# Patient Record
Sex: Male | Born: 1935 | Race: White | Hispanic: No | Smoking: Former smoker
Health system: Southern US, Community
[De-identification: ages and names within clinical notes are randomized; demographics above are authoritative.]

## PROBLEM LIST (undated history)

## (undated) DIAGNOSIS — E785 Hyperlipidemia, unspecified: Secondary | ICD-10-CM

## (undated) DIAGNOSIS — F329 Major depressive disorder, single episode, unspecified: Secondary | ICD-10-CM

## (undated) DIAGNOSIS — C679 Malignant neoplasm of bladder, unspecified: Secondary | ICD-10-CM

## (undated) DIAGNOSIS — F0390 Unspecified dementia without behavioral disturbance: Secondary | ICD-10-CM

## (undated) DIAGNOSIS — J449 Chronic obstructive pulmonary disease, unspecified: Secondary | ICD-10-CM

## (undated) DIAGNOSIS — F32A Depression, unspecified: Secondary | ICD-10-CM

## (undated) DIAGNOSIS — E119 Type 2 diabetes mellitus without complications: Secondary | ICD-10-CM

## (undated) DIAGNOSIS — F419 Anxiety disorder, unspecified: Secondary | ICD-10-CM

## (undated) HISTORY — PX: CHOLECYSTECTOMY: SHX55

## (undated) HISTORY — DX: Anxiety disorder, unspecified: F41.9

## (undated) HISTORY — DX: Hyperlipidemia, unspecified: E78.5

## (undated) HISTORY — DX: Type 2 diabetes mellitus without complications: E11.9

## (undated) HISTORY — PX: FOOT SURGERY: SHX648

## (undated) HISTORY — PX: BACK SURGERY: SHX140

## (undated) HISTORY — DX: Depression, unspecified: F32.A

## (undated) HISTORY — DX: Major depressive disorder, single episode, unspecified: F32.9

## (undated) HISTORY — DX: Unspecified dementia, unspecified severity, without behavioral disturbance, psychotic disturbance, mood disturbance, and anxiety: F03.90

## (undated) HISTORY — DX: Malignant neoplasm of bladder, unspecified: C67.9

---

## 2004-09-13 ENCOUNTER — Emergency Department: Payer: Self-pay | Admitting: Emergency Medicine

## 2004-09-23 ENCOUNTER — Ambulatory Visit: Payer: Self-pay | Admitting: General Surgery

## 2004-10-11 ENCOUNTER — Ambulatory Visit: Payer: Self-pay

## 2005-09-06 ENCOUNTER — Emergency Department: Payer: Self-pay | Admitting: Emergency Medicine

## 2007-02-17 ENCOUNTER — Emergency Department: Payer: Self-pay | Admitting: Emergency Medicine

## 2007-08-31 ENCOUNTER — Inpatient Hospital Stay: Payer: Self-pay | Admitting: Unknown Physician Specialty

## 2009-04-25 DIAGNOSIS — C679 Malignant neoplasm of bladder, unspecified: Secondary | ICD-10-CM

## 2009-04-25 HISTORY — DX: Malignant neoplasm of bladder, unspecified: C67.9

## 2009-12-01 ENCOUNTER — Ambulatory Visit: Payer: Self-pay | Admitting: General Practice

## 2010-01-27 ENCOUNTER — Ambulatory Visit: Payer: Self-pay | Admitting: Urology

## 2010-02-10 ENCOUNTER — Ambulatory Visit: Payer: Self-pay | Admitting: Urology

## 2010-02-11 ENCOUNTER — Emergency Department: Payer: Self-pay | Admitting: Emergency Medicine

## 2010-02-11 LAB — PATHOLOGY REPORT

## 2010-02-15 ENCOUNTER — Emergency Department: Payer: Self-pay | Admitting: Emergency Medicine

## 2010-02-17 ENCOUNTER — Inpatient Hospital Stay: Payer: Self-pay | Admitting: Internal Medicine

## 2011-01-29 ENCOUNTER — Emergency Department: Payer: Self-pay | Admitting: Emergency Medicine

## 2011-03-26 ENCOUNTER — Inpatient Hospital Stay: Payer: Self-pay

## 2011-05-16 ENCOUNTER — Emergency Department: Payer: Self-pay | Admitting: Emergency Medicine

## 2011-10-07 ENCOUNTER — Ambulatory Visit: Payer: Self-pay | Admitting: Internal Medicine

## 2011-10-07 LAB — CREATININE, SERUM
Creatinine: 0.89 mg/dL (ref 0.60–1.30)
EGFR (Non-African Amer.): >60

## 2011-12-22 ENCOUNTER — Emergency Department: Payer: Self-pay | Admitting: Unknown Physician Specialty

## 2011-12-22 LAB — URINALYSIS, COMPLETE
Bilirubin,UR: NEGATIVE
Blood: NEGATIVE
Ketone: NEGATIVE
Ph: 5 (ref 4.5–8.0)
Squamous Epithelial: 1

## 2011-12-22 LAB — COMPREHENSIVE METABOLIC PANEL
Albumin: 3.4 g/dL (ref 3.4–5.0)
Anion Gap: 7 (ref 7–16)
BUN: 20 mg/dL — ABNORMAL HIGH (ref 7–18)
Glucose: 112 mg/dL — ABNORMAL HIGH (ref 65–99)
Osmolality: 286 (ref 275–301)
Potassium: 4.5 mmol/L (ref 3.5–5.1)
Sodium: 142 mmol/L (ref 136–145)
Total Protein: 7.1 g/dL (ref 6.4–8.2)

## 2011-12-22 LAB — CBC
HCT: 41.2 % (ref 40.0–52.0)
HGB: 13.6 g/dL (ref 13.0–18.0)
MCH: 32.4 pg (ref 26.0–34.0)
MCV: 98 fL (ref 80–100)
RBC: 4.21 10*6/uL — ABNORMAL LOW (ref 4.40–5.90)

## 2012-04-08 ENCOUNTER — Emergency Department: Payer: Self-pay | Admitting: Emergency Medicine

## 2012-04-08 LAB — URINALYSIS, COMPLETE
Bacteria: NONE SEEN
Bilirubin,UR: NEGATIVE
Glucose,UR: NEGATIVE mg/dL (ref 0–75)
Ketone: NEGATIVE
Nitrite: NEGATIVE
RBC,UR: 2 /HPF (ref 0–5)
Squamous Epithelial: NONE SEEN
WBC UR: 2 /HPF (ref 0–5)

## 2012-04-08 LAB — COMPREHENSIVE METABOLIC PANEL
Albumin: 3.2 g/dL — ABNORMAL LOW (ref 3.4–5.0)
Alkaline Phosphatase: 77 U/L (ref 50–136)
BUN: 15 mg/dL (ref 7–18)
Chloride: 109 mmol/L — ABNORMAL HIGH (ref 98–107)
SGOT(AST): 45 U/L — ABNORMAL HIGH (ref 15–37)
SGPT (ALT): 28 U/L (ref 12–78)
Total Protein: 6.6 g/dL (ref 6.4–8.2)

## 2012-04-08 LAB — CBC
MCHC: 33.7 g/dL (ref 32.0–36.0)
Platelet: 188 10*3/uL (ref 150–440)
RDW: 13.4 % (ref 11.5–14.5)
WBC: 6.6 10*3/uL (ref 3.8–10.6)

## 2012-04-08 LAB — TROPONIN I: Troponin-I: <0.02 ng/mL

## 2012-04-09 ENCOUNTER — Encounter: Payer: Self-pay | Admitting: *Deleted

## 2012-04-10 ENCOUNTER — Ambulatory Visit (INDEPENDENT_AMBULATORY_CARE_PROVIDER_SITE_OTHER): Payer: Medicare Other | Admitting: Cardiovascular Disease

## 2012-04-10 ENCOUNTER — Encounter: Payer: Self-pay | Admitting: Cardiovascular Disease

## 2012-04-10 VITALS — BP 121/75 | HR 57 | Ht 70.5 in | Wt 189.0 lb

## 2012-04-10 DIAGNOSIS — R55 Syncope and collapse: Secondary | ICD-10-CM | POA: Insufficient documentation

## 2012-04-10 NOTE — Patient Instructions (Addendum)
Your physician has requested that you have an echocardiogram. Echocardiography is a painless test that uses sound waves to create images of your heart. It provides your doctor with information about the size and shape of your heart and how well your heart's chambers and valves are working. This procedure takes approximately one hour. There are no restrictions for this procedure.  Your physician has recommended that you wear a holter monitor. Holter monitors are medical devices that record the heart's electrical activity. Doctors most often use these monitors to diagnose arrhythmias. Arrhythmias are problems with the speed or rhythm of the heartbeat. The monitor is a small, portable device. You can wear one while you do your normal daily activities. This is usually used to diagnose what is causing palpitations/syncope (passing out).  Follow up after tests.  

## 2012-04-10 NOTE — Progress Notes (Signed)
Primary care physician: Dr. Marcello Fennel  HPI  This is an 78 year old male who was referred from the emergency room at [facility] for evaluation of presyncope and sinus bradycardia. The patient is not aware of any previous cardiac history. He has chronic medical conditions that include hyperlipidemia, mild dementia, diet-controlled diabetes, depression and history of bladder cancer status post resection in 2011. He also reports to me been diagnosed with hypothyroidism few weeks ago. He was started on levothyroxine 50 mcg once daily. Over the last few weeks, the patient has noticed progressive lightheadedness without syncope. It was particularly severe 2 days ago and thus he went to the emergency room at [facility]. He was noted to be bradycardic with a heart rate of 46 beats per minute. His labs were unremarkable. There was no evidence of anemia or volume depletion. He was discharged home and asked to followup with [place]. He denies any chest pain or dyspnea. He is not on any antihypertensive medications.  No Known Allergies   Current Outpatient Prescriptions on File Prior to Visit  Medication Sig Dispense Refill  . levothyroxine (SYNTHROID, LEVOTHROID) 50 MCG tablet Take 50 mcg by mouth daily.      Marland Kitchen PARoxetine (PAXIL) 20 MG tablet Take 20 mg by mouth every morning.      . pravastatin (PRAVACHOL) 40 MG tablet Take 40 mg by mouth daily.         Past Medical History  Diagnosis Date  . Hyperlipidemia   . Dementia   . Gestational diabetes, diet controlled   . Depression   . Anxiety   . Bladder cancer 2011    s/p resection with mitomycin injection;Dr.Stoioff     Past Surgical History  Procedure Date  . Back surgery     x4  . Cholecystectomy   . Foot surgery      History reviewed. No pertinent family history.   History   Social History  . Marital Status: Married    Spouse Name: N/A    Number of Children: N/A  . Years of Education: N/A   Occupational History  . Not on file.   Social History  Main Topics  . Smoking status: Former Smoker -- 1.5 packs/day for 25 years    Types: Cigarettes  . Smokeless tobacco: Not on file  . Alcohol Use: No  . Drug Use: No  . Sexually Active:    Other Topics Concern  . Not on file   Social History Narrative  . No narrative on file     ROS Constitutional: Negative for fever, chills, diaphoresis, activity change, appetite change.   HENT: Negative for hearing loss, nosebleeds, congestion, sore throat, facial swelling, drooling, trouble swallowing, neck pain, voice change, sinus pressure and tinnitus.  Eyes: Negative for photophobia, pain, discharge and visual disturbance.  Respiratory: Negative for apnea, cough, chest tightness, shortness of breath and wheezing.  Cardiovascular: Negative for chest pain, palpitations and leg swelling.  Gastrointestinal: Negative for nausea, vomiting, abdominal pain, diarrhea, constipation, blood in stool and abdominal distention.  Genitourinary: Negative for dysuria, urgency, frequency, hematuria and decreased urine volume.  Skin: Negative for color change, pallor, rash and wound.  Neurological: Negative for  tremors, seizures, syncope, speech difficulty, weakness, light-headedness, numbness and headaches.  Psychiatric/Behavioral: Negative for suicidal ideas, hallucinations, behavioral problems and agitation. The patient is not nervous/anxious.     PHYSICAL EXAM   BP 121/75  Pulse 57  Ht 5' 10.5" (1.791 m)  Wt 189 lb (85.73 kg)  BMI 26.74 kg/m2  Constitutional: He is oriented to person, place, and time. He appears well-developed and well-nourished. No distress.  HENT: No nasal discharge.  Head: Normocephalic and atraumatic.  Eyes: Pupils are equal and round. Right eye exhibits no discharge. Left eye exhibits no discharge.  Neck: Normal range of motion. Neck supple. No JVD present. No thyromegaly present.  Cardiovascular: Bradycardic, regular rhythm, normal heart sounds and. Exam reveals no gallop and  no friction rub. No murmur heard.  Pulmonary/Chest: Effort normal and breath sounds normal. No stridor. No respiratory distress. He has no wheezes. He has no rales. He exhibits no tenderness.  Abdominal: Soft. Bowel sounds are normal. He exhibits no distension. There is no tenderness. There is no rebound and no guarding.  Musculoskeletal: Normal range of motion. He exhibits no edema and no tenderness.  Neurological: He is alert and oriented to person, place, and time. Coordination normal.  Skin: Skin is warm and dry. No rash noted. He is not diaphoretic. No erythema. No pallor.  Psychiatric: He has a normal mood and affect. His behavior is normal. Judgment and thought content normal.      EKG: Marked sinus  Bradycardia  BORDERLINE RHYTHM   ASSESSMENT AND PLAN

## 2012-04-10 NOTE — Assessment & Plan Note (Signed)
Recent symptoms of dizziness associated with presyncope with baseline sinus bradycardia. There is no evidence of AV block or significant pauses on the ECG. Recent labs were unremarkable. He was recently started on levothyroxine for hypothyroidism which might be contributing to his sinus bradycardia. I recommend evaluation with a 48-hour Holter monitor as well as an echocardiogram to evaluate LV systolic function. I will have him followup with me after her cardiac testing. I will likely repeat his TSH at that time. He has no symptoms suggestive of angina.

## 2012-04-16 ENCOUNTER — Encounter: Payer: Self-pay | Admitting: Cardiovascular Disease

## 2012-04-19 ENCOUNTER — Other Ambulatory Visit (INDEPENDENT_AMBULATORY_CARE_PROVIDER_SITE_OTHER): Payer: Medicare Other

## 2012-04-19 ENCOUNTER — Encounter (INDEPENDENT_AMBULATORY_CARE_PROVIDER_SITE_OTHER): Payer: Medicare Other

## 2012-04-19 ENCOUNTER — Other Ambulatory Visit: Payer: Self-pay

## 2012-04-19 DIAGNOSIS — R55 Syncope and collapse: Secondary | ICD-10-CM

## 2012-04-19 DIAGNOSIS — R42 Dizziness and giddiness: Secondary | ICD-10-CM

## 2012-04-19 NOTE — Progress Notes (Unsigned)
Placed 48 hour holter monitor on 04/19/12-04/21/12. Arida.

## 2012-04-26 ENCOUNTER — Ambulatory Visit: Payer: Medicare Other | Admitting: Cardiovascular Disease

## 2012-04-30 ENCOUNTER — Telehealth: Payer: Self-pay

## 2012-04-30 NOTE — Telephone Encounter (Signed)
Dr. Kirke Corin reviewed holter results They are as follows: "normal sinus rhythm. Sinus bradycardia lowest 39 BPM during sleep. Longest pause was 1.8 seconds" VO Dr. Adline Mango, RN Will call pt to inform

## 2012-04-30 NOTE — Telephone Encounter (Signed)
Pt informed Understanding verb appt confirmed for 05/17/12

## 2012-05-17 ENCOUNTER — Encounter: Payer: Self-pay | Admitting: *Deleted

## 2012-05-17 ENCOUNTER — Ambulatory Visit (INDEPENDENT_AMBULATORY_CARE_PROVIDER_SITE_OTHER): Payer: Medicare Other | Admitting: Cardiovascular Disease

## 2012-05-17 ENCOUNTER — Encounter: Payer: Self-pay | Admitting: Cardiovascular Disease

## 2012-05-17 VITALS — BP 118/64 | HR 56 | Ht 70.5 in | Wt 287.2 lb

## 2012-05-17 DIAGNOSIS — R55 Syncope and collapse: Secondary | ICD-10-CM

## 2012-05-17 NOTE — Patient Instructions (Addendum)
Follow up in 6 months 

## 2012-05-17 NOTE — Progress Notes (Signed)
Primary care physician: Dr. Marcello Fennel  HPI  This is an 79 year old male who is here today for a followup visit regarding presyncope and sinus bradycardia. The patient is not aware of any previous cardiac history. He has chronic medical conditions that include hyperlipidemia, mild dementia, diet-controlled diabetes, depression and history of bladder cancer status post resection in 2011. He was diagnosed with hypothyroidism and started on levothyroxine 50 mcg once daily. Around the same time, he started having dizziness and presyncopal episode. He went to the emergency room at [facility] and was found to have a heart rate of 46 beats per minute. He underwent a 48 hour Holter monitor which showed sinus rhythm with sinus bradycardia. Lowest heart rate was 39 beats per minute likely during sleep. Longest pause was 1.8 second. Overall, he has been feeling better with less dizziness. His heart rate today is 56 beats per minute. Echocardiogram was overall unremarkable with normal LV systolic function without significant valvular abnormalities.   No Known Allergies   Current Outpatient Prescriptions on File Prior to Visit  Medication Sig Dispense Refill  . levothyroxine (SYNTHROID, LEVOTHROID) 50 MCG tablet Take 50 mcg by mouth daily.      . Oxymetazoline HCl (NASAL SPRAY NA) Place 0.06 % into the nose 3 (three) times daily.      Marland Kitchen PARoxetine (PAXIL) 20 MG tablet Take 20 mg by mouth every morning.      . pravastatin (PRAVACHOL) 40 MG tablet Take 40 mg by mouth daily.         Past Medical History  Diagnosis Date  . Hyperlipidemia   . Dementia   . Gestational diabetes, diet controlled   . Depression   . Anxiety   . Bladder cancer 2011    s/p resection with mitomycin injection;Dr.Stoioff     Past Surgical History  Procedure Date  . Back surgery     x4  . Cholecystectomy   . Foot surgery      History reviewed. No pertinent family history.   History   Social History  . Marital Status: Married     Spouse Name: N/A    Number of Children: N/A  . Years of Education: N/A   Occupational History  . Not on file.   Social History Main Topics  . Smoking status: Former Smoker -- 1.5 packs/day for 25 years    Types: Cigarettes  . Smokeless tobacco: Not on file  . Alcohol Use: No  . Drug Use: No  . Sexually Active:    Other Topics Concern  . Not on file   Social History Narrative  . No narrative on file      PHYSICAL EXAM   BP 118/64  Pulse 56  Ht 5' 10.5" (1.791 m)  Wt 287 lb 4 oz (130.296 kg)  BMI 40.63 kg/m2  Constitutional: He is oriented to person, place, and time. He appears well-developed and well-nourished. No distress.  HENT: No nasal discharge.  Head: Normocephalic and atraumatic.  Eyes: Pupils are equal and round. Right eye exhibits no discharge. Left eye exhibits no discharge.  Neck: Normal range of motion. Neck supple. No JVD present. No thyromegaly present.  Cardiovascular: Bradycardic, regular rhythm, normal heart sounds and. Exam reveals no gallop and no friction rub. No murmur heard.  Pulmonary/Chest: Effort normal and breath sounds normal. No stridor. No respiratory distress. He has no wheezes. He has no rales. He exhibits no tenderness.  Abdominal: Soft. Bowel sounds are normal. He exhibits no distension. There is no  tenderness. There is no rebound and no guarding.  Musculoskeletal: Normal range of motion. He exhibits no edema and no tenderness.  Neurological: He is alert and oriented to person, place, and time. Coordination normal.  Skin: Skin is warm and dry. No rash noted. He is not diaphoretic. No erythema. No pallor.  Psychiatric: He has a normal mood and affect. His behavior is normal. Judgment and thought content normal.      ASSESSMENT AND PLAN

## 2012-05-17 NOTE — Assessment & Plan Note (Signed)
This was likely due to sinus bradycardia in the setting of hypothyroidism. His symptoms have improved since he has been started on Synthroid. There is no current indication for a permanent pacemaker placement. He is going to followup with Dr. Marcello Fennel for further blood work and adjustment of his thyroid medication if needed. I will have him followup with me in 6 months to ensure cardiac stability.

## 2012-10-09 ENCOUNTER — Ambulatory Visit: Payer: Self-pay | Admitting: Psychiatry

## 2012-10-09 LAB — CREATININE, SERUM
EGFR (African American): >60
EGFR (Non-African Amer.): >60

## 2012-12-15 ENCOUNTER — Emergency Department: Payer: Self-pay | Admitting: Emergency Medicine

## 2012-12-15 LAB — CBC
HCT: 42.6 % (ref 40.0–52.0)
MCH: 32.8 pg (ref 26.0–34.0)
MCHC: 34.2 g/dL (ref 32.0–36.0)
MCV: 96 fL (ref 80–100)
Platelet: 224 10*3/uL (ref 150–440)
RDW: 13.6 % (ref 11.5–14.5)
WBC: 14.9 10*3/uL — ABNORMAL HIGH (ref 3.8–10.6)

## 2012-12-15 LAB — URINALYSIS, COMPLETE
Bacteria: NONE SEEN
Glucose,UR: 100 mg/dL (ref 0–75)
Ketone: NEGATIVE
Nitrite: NEGATIVE
RBC,UR: 3 /HPF (ref 0–5)
Squamous Epithelial: <1

## 2012-12-15 LAB — COMPREHENSIVE METABOLIC PANEL
Albumin: 3.6 g/dL (ref 3.4–5.0)
Alkaline Phosphatase: 115 U/L (ref 50–136)
Anion Gap: 7 (ref 7–16)
BUN: 12 mg/dL (ref 7–18)
Calcium, Total: 9.3 mg/dL (ref 8.5–10.1)
Chloride: 105 mmol/L (ref 98–107)
Co2: 27 mmol/L (ref 21–32)
Creatinine: 1.04 mg/dL (ref 0.60–1.30)
EGFR (Non-African Amer.): >60
Osmolality: 278 (ref 275–301)
SGOT(AST): 36 U/L (ref 15–37)
Total Protein: 7.8 g/dL (ref 6.4–8.2)

## 2012-12-15 LAB — TSH: Thyroid Stimulating Horm: 1.92 u[IU]/mL

## 2012-12-15 LAB — TROPONIN I: Troponin-I: <0.02 ng/mL

## 2013-01-04 ENCOUNTER — Ambulatory Visit (INDEPENDENT_AMBULATORY_CARE_PROVIDER_SITE_OTHER): Payer: Medicare Other | Admitting: Cardiovascular Disease

## 2013-01-04 ENCOUNTER — Encounter: Payer: Self-pay | Admitting: Cardiovascular Disease

## 2013-01-04 VITALS — BP 100/58 | HR 49 | Ht 70.5 in | Wt 184.0 lb

## 2013-01-04 DIAGNOSIS — I498 Other specified cardiac arrhythmias: Secondary | ICD-10-CM

## 2013-01-04 DIAGNOSIS — R001 Bradycardia, unspecified: Secondary | ICD-10-CM | POA: Insufficient documentation

## 2013-01-04 DIAGNOSIS — R55 Syncope and collapse: Secondary | ICD-10-CM

## 2013-01-04 NOTE — Patient Instructions (Addendum)
Continue same medications.   Follow up as needed.  

## 2013-01-04 NOTE — Progress Notes (Signed)
Primary care physician: Dr. Marcello Fennel  HPI  This is an 79 year old male who is here today for a followup visit regarding presyncope and sinus bradycardia. He has chronic medical conditions that include hyperlipidemia, mild dementia, diet-controlled diabetes, depression and history of bladder cancer status post resection in 2011. He was diagnosed with hypothyroidism and started on levothyroxine 50 mcg once daily early this year. Around the same time, he started having dizziness and presyncopal episode. He went to the emergency room at [facility] and was found to have a heart rate of 46 beats per minute. He underwent a 48 hour Holter monitor which showed sinus rhythm with sinus bradycardia. Lowest heart rate was 39 beats per minute likely during sleep. Longest pause was 1.8 second.  He has been doing reasonably well and denies any recurrent episodes of dizziness, presyncope or syncope.   No Known Allergies   Current Outpatient Prescriptions on File Prior to Visit  Medication Sig Dispense Refill  . ipratropium (ATROVENT) 0.06 % nasal spray Place 2 sprays into the nose every 8 (eight) hours.      Marland Kitchen levothyroxine (SYNTHROID, LEVOTHROID) 50 MCG tablet Take 50 mcg by mouth daily.      Marland Kitchen PARoxetine (PAXIL) 20 MG tablet Take 20 mg by mouth every morning.      . pravastatin (PRAVACHOL) 40 MG tablet Take 40 mg by mouth daily.       No current facility-administered medications on file prior to visit.     Past Medical History  Diagnosis Date  . Hyperlipidemia   . Dementia   . Gestational diabetes, diet controlled   . Depression   . Anxiety   . Bladder cancer 2011    s/p resection with mitomycin injection;Dr.Stoioff     Past Surgical History  Procedure Laterality Date  . Back surgery      x4  . Cholecystectomy    . Foot surgery       No family history on file.   History   Social History  . Marital Status: Married    Spouse Name: N/A    Number of Children: N/A  . Years of Education: N/A    Occupational History  . Not on file.   Social History Main Topics  . Smoking status: Former Smoker -- 1.50 packs/day for 25 years    Types: Cigarettes  . Smokeless tobacco: Not on file  . Alcohol Use: No  . Drug Use: No  . Sexual Activity:    Other Topics Concern  . Not on file   Social History Narrative  . No narrative on file      PHYSICAL EXAM   BP 100/58  Pulse 49  Ht 5' 10.5" (1.791 m)  Wt 184 lb (83.462 kg)  BMI 26.02 kg/m2  Constitutional: He is oriented to person, place, and time. He appears well-developed and well-nourished. No distress.  HENT: No nasal discharge.  Head: Normocephalic and atraumatic.  Eyes: Pupils are equal and round. Right eye exhibits no discharge. Left eye exhibits no discharge.  Neck: Normal range of motion. Neck supple. No JVD present. No thyromegaly present.  Cardiovascular: Bradycardic, regular rhythm, normal heart sounds and. Exam reveals no gallop and no friction rub. No murmur heard.  Pulmonary/Chest: Effort normal and breath sounds normal. No stridor. No respiratory distress. He has no wheezes. He has no rales. He exhibits no tenderness.  Abdominal: Soft. Bowel sounds are normal. He exhibits no distension. There is no tenderness. There is no rebound and no guarding.  Musculoskeletal: Normal range of motion. He exhibits no edema and no tenderness.  Neurological: He is alert and oriented to person, place, and time. Coordination normal.  Skin: Skin is warm and dry. No rash noted. He is not diaphoretic. No erythema. No pallor.  Psychiatric: He has a normal mood and affect. His behavior is normal. Judgment and thought content normal.   EKG: Sinus bradycardia with a heart rate of 49 beats per minute.   ASSESSMENT AND PLAN

## 2013-01-04 NOTE — Assessment & Plan Note (Signed)
The patient has sinus bradycardia but currently he is asymptomatic. This has improved after treating his hypothyroidism. Continue observation. I instructed him to followup with me if he becomes symptomatic.

## 2013-05-20 ENCOUNTER — Emergency Department: Payer: Self-pay | Admitting: Emergency Medicine

## 2013-05-20 LAB — URINALYSIS, COMPLETE
Bacteria: NONE SEEN
Bilirubin,UR: NEGATIVE
Glucose,UR: NEGATIVE mg/dL (ref 0–75)
KETONE: NEGATIVE
LEUKOCYTE ESTERASE: NEGATIVE
NITRITE: NEGATIVE
Ph: 5 (ref 4.5–8.0)
Protein: NEGATIVE
Specific Gravity: 1.012 (ref 1.003–1.030)
Squamous Epithelial: NONE SEEN
WBC UR: <1 /HPF (ref 0–5)

## 2013-05-20 LAB — COMPREHENSIVE METABOLIC PANEL
ANION GAP: 3 — AB (ref 7–16)
Albumin: 3.3 g/dL — ABNORMAL LOW (ref 3.4–5.0)
Alkaline Phosphatase: 79 U/L
BUN: 19 mg/dL — AB (ref 7–18)
Bilirubin,Total: 0.4 mg/dL (ref 0.2–1.0)
CHLORIDE: 106 mmol/L (ref 98–107)
CO2: 28 mmol/L (ref 21–32)
Calcium, Total: 8.8 mg/dL (ref 8.5–10.1)
Creatinine: 1.1 mg/dL (ref 0.60–1.30)
EGFR (Non-African Amer.): >60
GLUCOSE: 138 mg/dL — AB (ref 65–99)
Osmolality: 278 (ref 275–301)
POTASSIUM: 3.8 mmol/L (ref 3.5–5.1)
SGOT(AST): 27 U/L (ref 15–37)
SGPT (ALT): 25 U/L (ref 12–78)
SODIUM: 137 mmol/L (ref 136–145)
TOTAL PROTEIN: 6.7 g/dL (ref 6.4–8.2)

## 2013-05-20 LAB — CBC
HCT: 40 % (ref 40.0–52.0)
HGB: 13.3 g/dL (ref 13.0–18.0)
MCH: 32.4 pg (ref 26.0–34.0)
MCHC: 33.3 g/dL (ref 32.0–36.0)
MCV: 97 fL (ref 80–100)
PLATELETS: 221 10*3/uL (ref 150–440)
RBC: 4.11 10*6/uL — ABNORMAL LOW (ref 4.40–5.90)
RDW: 13.9 % (ref 11.5–14.5)
WBC: 8.5 10*3/uL (ref 3.8–10.6)

## 2013-05-20 LAB — TROPONIN I: Troponin-I: <0.02 ng/mL

## 2013-07-23 ENCOUNTER — Inpatient Hospital Stay: Payer: Self-pay | Admitting: Internal Medicine

## 2013-07-23 LAB — CBC
HCT: 35.3 % — ABNORMAL LOW (ref 40.0–52.0)
HGB: 11.5 g/dL — ABNORMAL LOW (ref 13.0–18.0)
MCH: 31.6 pg (ref 26.0–34.0)
MCHC: 32.6 g/dL (ref 32.0–36.0)
MCV: 97 fL (ref 80–100)
PLATELETS: 213 10*3/uL (ref 150–440)
RBC: 3.64 10*6/uL — ABNORMAL LOW (ref 4.40–5.90)
RDW: 13 % (ref 11.5–14.5)
WBC: 13.2 10*3/uL — AB (ref 3.8–10.6)

## 2013-07-23 LAB — URINALYSIS, COMPLETE
BILIRUBIN, UR: NEGATIVE
Bacteria: NONE SEEN
GLUCOSE, UR: NEGATIVE mg/dL (ref 0–75)
Ketone: NEGATIVE
Leukocyte Esterase: NEGATIVE
NITRITE: NEGATIVE
Ph: 6 (ref 4.5–8.0)
Specific Gravity: 1.02 (ref 1.003–1.030)
Squamous Epithelial: <1
WBC UR: 5 /HPF (ref 0–5)

## 2013-07-23 LAB — COMPREHENSIVE METABOLIC PANEL
AST: 40 U/L — AB (ref 15–37)
Albumin: 2.4 g/dL — ABNORMAL LOW (ref 3.4–5.0)
Alkaline Phosphatase: 82 U/L
Anion Gap: 4 — ABNORMAL LOW (ref 7–16)
BUN: 12 mg/dL (ref 7–18)
Bilirubin,Total: 0.8 mg/dL (ref 0.2–1.0)
Calcium, Total: 8.1 mg/dL — ABNORMAL LOW (ref 8.5–10.1)
Chloride: 106 mmol/L (ref 98–107)
Co2: 27 mmol/L (ref 21–32)
Creatinine: 1.02 mg/dL (ref 0.60–1.30)
EGFR (African American): >60
EGFR (Non-African Amer.): >60
Glucose: 108 mg/dL — ABNORMAL HIGH (ref 65–99)
OSMOLALITY: 274 (ref 275–301)
POTASSIUM: 4 mmol/L (ref 3.5–5.1)
SGPT (ALT): 32 U/L (ref 12–78)
Sodium: 137 mmol/L (ref 136–145)
TOTAL PROTEIN: 7 g/dL (ref 6.4–8.2)

## 2013-07-23 LAB — TROPONIN I: Troponin-I: <0.02 ng/mL

## 2013-07-23 LAB — TSH: THYROID STIMULATING HORM: 1.08 u[IU]/mL

## 2013-07-24 LAB — CBC WITH DIFFERENTIAL/PLATELET
Basophil #: 0.1 10*3/uL (ref 0.0–0.1)
Basophil %: 0.4 %
EOS ABS: 0 10*3/uL (ref 0.0–0.7)
EOS PCT: 0.1 %
HCT: 33.5 % — ABNORMAL LOW (ref 40.0–52.0)
HGB: 11 g/dL — AB (ref 13.0–18.0)
LYMPHS ABS: 1 10*3/uL (ref 1.0–3.6)
Lymphocyte %: 7.1 %
MCH: 31.3 pg (ref 26.0–34.0)
MCHC: 32.8 g/dL (ref 32.0–36.0)
MCV: 95 fL (ref 80–100)
MONO ABS: 1.5 x10 3/mm — AB (ref 0.2–1.0)
MONOS PCT: 10.2 %
NEUTROS ABS: 12 10*3/uL — AB (ref 1.4–6.5)
Neutrophil %: 82.2 %
Platelet: 209 10*3/uL (ref 150–440)
RBC: 3.52 10*6/uL — ABNORMAL LOW (ref 4.40–5.90)
RDW: 12.9 % (ref 11.5–14.5)
WBC: 14.6 10*3/uL — ABNORMAL HIGH (ref 3.8–10.6)

## 2013-07-24 LAB — BASIC METABOLIC PANEL
Anion Gap: 5 — ABNORMAL LOW (ref 7–16)
BUN: 13 mg/dL (ref 7–18)
CALCIUM: 8 mg/dL — AB (ref 8.5–10.1)
CHLORIDE: 107 mmol/L (ref 98–107)
CREATININE: 1.04 mg/dL (ref 0.60–1.30)
Co2: 27 mmol/L (ref 21–32)
EGFR (Non-African Amer.): >60
GLUCOSE: 88 mg/dL (ref 65–99)
OSMOLALITY: 277 (ref 275–301)
Potassium: 3.3 mmol/L — ABNORMAL LOW (ref 3.5–5.1)
SODIUM: 139 mmol/L (ref 136–145)

## 2013-07-24 LAB — MAGNESIUM: Magnesium: 1.6 mg/dL — ABNORMAL LOW

## 2013-07-25 LAB — CBC WITH DIFFERENTIAL/PLATELET
Basophil #: 0 10*3/uL (ref 0.0–0.1)
Basophil %: 0.4 %
EOS ABS: 0.1 10*3/uL (ref 0.0–0.7)
Eosinophil %: 1 %
HCT: 33.8 % — ABNORMAL LOW (ref 40.0–52.0)
HGB: 11.4 g/dL — AB (ref 13.0–18.0)
Lymphocyte #: 1.1 10*3/uL (ref 1.0–3.6)
Lymphocyte %: 10.9 %
MCH: 32.3 pg (ref 26.0–34.0)
MCHC: 33.9 g/dL (ref 32.0–36.0)
MCV: 96 fL (ref 80–100)
Monocyte #: 1 x10 3/mm (ref 0.2–1.0)
Monocyte %: 9.8 %
NEUTROS ABS: 7.6 10*3/uL — AB (ref 1.4–6.5)
Neutrophil %: 77.9 %
Platelet: 237 10*3/uL (ref 150–440)
RBC: 3.54 10*6/uL — AB (ref 4.40–5.90)
RDW: 13.1 % (ref 11.5–14.5)
WBC: 9.8 10*3/uL (ref 3.8–10.6)

## 2013-07-25 LAB — HEMOGLOBIN A1C: Hemoglobin A1C: 6.2 % (ref 4.2–6.3)

## 2013-07-25 LAB — BASIC METABOLIC PANEL
Anion Gap: 6 — ABNORMAL LOW (ref 7–16)
BUN: 13 mg/dL (ref 7–18)
CALCIUM: 8.5 mg/dL (ref 8.5–10.1)
CO2: 27 mmol/L (ref 21–32)
Chloride: 106 mmol/L (ref 98–107)
Creatinine: 1.09 mg/dL (ref 0.60–1.30)
EGFR (African American): >60
GLUCOSE: 99 mg/dL (ref 65–99)
Osmolality: 278 (ref 275–301)
Potassium: 4.1 mmol/L (ref 3.5–5.1)
SODIUM: 139 mmol/L (ref 136–145)

## 2013-07-26 LAB — BASIC METABOLIC PANEL
ANION GAP: 8 (ref 7–16)
BUN: 20 mg/dL — ABNORMAL HIGH (ref 7–18)
CALCIUM: 8.8 mg/dL (ref 8.5–10.1)
Chloride: 104 mmol/L (ref 98–107)
Co2: 27 mmol/L (ref 21–32)
Creatinine: 1.11 mg/dL (ref 0.60–1.30)
GLUCOSE: 94 mg/dL (ref 65–99)
Osmolality: 280 (ref 275–301)
Potassium: 3.8 mmol/L (ref 3.5–5.1)
Sodium: 139 mmol/L (ref 136–145)

## 2013-07-26 LAB — CBC WITH DIFFERENTIAL/PLATELET
Eosinophil: 3 %
HCT: 33.7 % — ABNORMAL LOW (ref 40.0–52.0)
HGB: 11.2 g/dL — ABNORMAL LOW (ref 13.0–18.0)
Lymphocytes: 17 %
MCH: 31.9 pg (ref 26.0–34.0)
MCHC: 33.3 g/dL (ref 32.0–36.0)
MCV: 96 fL (ref 80–100)
MYELOCYTE: 1 %
Metamyelocyte: 1 %
Monocytes: 9 %
Platelet: 263 10*3/uL (ref 150–440)
RBC: 3.52 10*6/uL — ABNORMAL LOW (ref 4.40–5.90)
RDW: 13.1 % (ref 11.5–14.5)
SEGMENTED NEUTROPHILS: 69 %
WBC: 8.7 10*3/uL (ref 3.8–10.6)

## 2013-07-26 LAB — MAGNESIUM: MAGNESIUM: 2 mg/dL

## 2013-07-27 LAB — BASIC METABOLIC PANEL
ANION GAP: 4 — AB (ref 7–16)
BUN: 19 mg/dL — ABNORMAL HIGH (ref 7–18)
CHLORIDE: 105 mmol/L (ref 98–107)
CREATININE: 1.03 mg/dL (ref 0.60–1.30)
Calcium, Total: 8.4 mg/dL — ABNORMAL LOW (ref 8.5–10.1)
Co2: 29 mmol/L (ref 21–32)
EGFR (African American): >60
EGFR (Non-African Amer.): >60
Glucose: 95 mg/dL (ref 65–99)
Osmolality: 278 (ref 275–301)
Potassium: 3.9 mmol/L (ref 3.5–5.1)
SODIUM: 138 mmol/L (ref 136–145)

## 2013-07-27 LAB — CBC WITH DIFFERENTIAL/PLATELET
Bands: 2 %
Basophil: 1 %
COMMENT - H1-COM3: NORMAL
HCT: 35.8 % — ABNORMAL LOW (ref 40.0–52.0)
HGB: 12 g/dL — ABNORMAL LOW (ref 13.0–18.0)
LYMPHS PCT: 23 %
MCH: 31.8 pg (ref 26.0–34.0)
MCHC: 33.5 g/dL (ref 32.0–36.0)
MCV: 95 fL (ref 80–100)
Monocytes: 9 %
Platelet: 314 10*3/uL (ref 150–440)
RBC: 3.77 10*6/uL — AB (ref 4.40–5.90)
RDW: 13.4 % (ref 11.5–14.5)
Segmented Neutrophils: 63 %
Variant Lymphocyte - H1-Rlymph: 2 %
WBC: 7.9 10*3/uL (ref 3.8–10.6)

## 2013-07-28 LAB — CULTURE, BLOOD (SINGLE)

## 2014-08-16 NOTE — Consult Note (Signed)
PATIENT NAME:  Barry Coffey, Barry Coffey MR#:  [id number] DATE OF BIRTH:  08-Apr-1926  DATE OF CONSULTATION:  07/25/2013  CONSULTING PHYSICIAN:  Elta Guadeloupe A. Marina Gravel, MD  DATE OF DICTATION: 07/26/2013   REASON FOR CONSULTATION: Skin lesion, anterior lower chest.   HISTORY OF PRESENT ILLNESS: This is an 81 year old white male who was admitted to the hospital for cellulitis and was noted to have a large lesion on his anterior chest. I was asked to comment. He states that he has had this for 4 to 5 years. Never drained anything. Does not hurt him. No bleeding.   PHYSICAL EXAMINATION: Demonstrates a well-marginated, superficial, what appears to be actinic keratosis with hair growing from it. There is no evidence of infection.   IMPRESSION: Benign-appearing skin lesion.   RECOMMENDATIONS: Treat his cellulitis. He can follow up with me in the office as an outpatient for consideration of excisional biopsy.   ____________________________ Jeannette How Marina Gravel, MD mab:lb D: 07/26/2013 11:59:00 ET T: 07/26/2013 12:23:32 ET JOB#: [id number]  cc: Elta Guadeloupe A. Marina Gravel, MD, <Dictator> Hortencia Conradi MD ELECTRONICALLY SIGNED 07/30/2013 13:13

## 2014-08-16 NOTE — Discharge Summary (Signed)
PATIENT NAME:  Barry Coffey, Barry Coffey MR#:  [id number] DATE OF BIRTH:  October 18, 1925  DATE OF ADMISSION:  07/23/2013 DATE OF DISCHARGE:  07/27/2013   DISCHARGE DIAGNOSES:  1. Cellulitis with fever.  2. 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  3. Diabetes mellitus, diet controlled.  4. Dementia, mild to moderate.  5. Hyperlipidemia.  6. Hypertension.   DISCHARGE MEDICATIONS:  1. Clindamycin 300 mg b.i.d. for 5 days.  2. Seroquel 25 mg 2 at bedtime.  3. Pravastatin 40 mg daily.  4. Synthroid 50 mcg daily.  5. Ipratropium nasal spray 2 sprays t.i.d.  6. Aricept 10 mg at bedtime.  7. Citalopram 10 mg daily.  8. Vitamin D3 5000 units daily.   REASON FOR ADMISSION: An 81 year old male presents with fever and rash.   Please see H and P for HPI, past medical history and physical exam.   HOSPITAL COURSE: The patient was admitted, placed on clindamycin. Blood cultures were negative. His rash on his chest and his back improved. He will need surgical resection of the lesion, which appears to me to be consistent with squamous cell carcinoma, as an outpatient. His fever came down. Pretty unstable on his feet. Physical therapy recommended home health physical therapy for balance issues. He will follow up with Dr. Geralyn Corwin and go home on 5 days of b.i.d. clindamycin.   ____________________________ Rusty Aus, MD mfm:lb D: 07/27/2013 10:04:27 ET T: 07/27/2013 10:08:49 ET JOB#: [id number]  cc: Rusty Aus, MD, <Dictator> Shandon Matson Roselee Culver MD ELECTRONICALLY SIGNED 07/27/2013 11:21

## 2014-08-16 NOTE — H&P (Signed)
PATIENT NAME:  Barry Coffey, Barry Coffey MR#:  [id number] DATE OF BIRTH:  1925-06-24  DATE OF ADMISSION:  07/23/2013  PRIMARY CARE PHYSICIAN: Dr. Ginette Pitman at [facility].   REFERRING PHYSICIAN: Dr. Carrie Mew.   CHIEF COMPLAINT: Weakness.   HISTORY OF PRESENT ILLNESS: An 81 year old male with past medical history of hyperlipidemia, hypertension, dementia, diabetes, depression; bladder cancer, status post resection with mitomycin injection via Dr. Bernardo Heater in October 2011, who lives at home with his son and daughter-in-law. He is living active independent life, also takes his dogs for a walk. Now for the last 2 or 3 days as per daughter-in-law, who is present in the room, the patient is more weak and he is needing assistance in his day-to-day activity. As per daughter-in-law, 2 days ago in the morning, he could not get up from his bed. She gave him walker, but he needed assistance to get up from the bed. After that, he was able to walk with the walker. Also had lost control on his urination and was peeing on himself, which was unusual for him, so finally daughter-in-law brought him to the hospital today. On further questioning, the patient states that he has some headache, and he say that he always has chills and he has to wear some fleece or some light warm clothes. He denies any fever or any change in urinary habits or cough. Daughter-in-law also noticed while helping him to take him to shower that he has some red rash on his left arm and his back, which is new as per her. The patient denies any itching in these areas. In ER, he was found having elevated white cell count and generalized weakness, but there was no other source of infection, so he was given for admission to hospitalist team for this cellulitis.   REVIEW OF SYSTEMS:      CONSTITUTIONAL: Negative for fever, but positive for fatigue and generalized weakness. No weight loss or weight gain.  EYES: No blurring, double vision, discharge or  redness.  EARS, NOSE, THROAT: No tinnitus, hearing loss, or ringing in the ears.  RESPIRATORY: No cough, wheezing, hemoptysis, shortness of breath.  CARDIOVASCULAR: No chest pain, orthopnea, edema, arrhythmia or palpitation.  GASTROINTESTINAL: No nausea, vomiting, diarrhea, abdominal pain.  GENITOURINARY: The patient denies any dysuria or hematuria, but his daughter-in-law states that he had incontinence.  ENDOCRINE: No heat or cold intolerance. No excessive sweating.  SKIN: The patient has rashes on his left arm and back.  NEUROLOGICAL: No numbness, but has generalized weakness. Has some tremors or shaking, which is chronic for him.   PAST MEDICAL HISTORY:  1.  Hyperlipidemia.  2.  Dementia.  3.  Diabetes, diet-controlled.  4.  Depression.  5.  Bladder cancer, status post resection.  6.  Hypertension.   PAST SURGICAL HISTORY:  1.  Back surgery x 4.  2.  Cholecystectomy.  3.  Foot surgery.   SOCIAL HISTORY: Lives in [place] with son and daughter-in-law and 2 grandsons. Quit tobacco 25 to 30 years ago. No alcohol or drug abuse.   FAMILY HISTORY: Mother had anxiety.   HOME MEDICATIONS: 1.  Vitamin D3, 5000 international units once a day.  2.  Quetiapine 25 mg oral 2 tablets once a day.  3.  Pravastatin 40 mg once a day.  4.  Levothyroxine 50 mcg once a day.  5.  Ipratropium nasal spray 2 sprays nasally every 8 hours.  6.  Donepezil 10 mg oral once a day.  7.  Citalopram  10 mg oral once a day.  PHYSICAL EXAMINATION:   VITAL SIGNS: In ER, temperature 98.7, pulse 52, respiratory rate 18, blood pressure 143/67 on presentation, currently 189/86, pulse ox 95 on room air.  GENERAL: The patient is fully alert and oriented. He is not in any acute distress.  HEAD AND NECK: Atraumatic. Conjunctivae pink. Oral mucosa moist.  NECK: Supple. No JVD.  RESPIRATORY: Bilateral clear and equal air entry.  CARDIOVASCULAR: S1, S2 present, regular. No murmur.  ABDOMEN: Soft, nontender. Bowel sounds  present. No organomegaly.  SKIN: There is reddening and somewhat warm to touch rash on left arm and almost two thirds area of his back. No signs of scratch marks or injuries. He also has some chronic rash with some crusting on it and yellowish dried secretions on his epigastric area and on right hand.  EXTREMITIES: Legs: No edema.  NEUROLOGICAL: Power 4/5. Follows commands. Moves all 4 limbs. There is coarse tremor, involuntary, present all the time. MUSCULOSKELETAL: Joints: No swelling or tenderness.   IMPORTANT LABORATORY AND IMAGING RESULTS: Glucose 108, BUN 12, creatinine 1.02, sodium 137, potassium 4.0, chloride 106, CO2 of 27; anion gap is 4 and calcium is 8.1. Total protein 7.0, albumin 2.4, bilirubin 0.8, alkaline phosphate 82, SGOT 40, and SGPT is 32. Troponin is less than 0.02. WBC 13.2, hemoglobin 11.5, and platelet count 213. Urinalysis is grossly negative. Chest x-ray is negative. CT scan of the head is without any acute findings.   ASSESSMENT AND PLAN: An 81 year old male with a past medical history of hyperlipidemia, dementia, diabetes, and depression. He is brought to Emergency Room with a complaint of feeling weak and urinary incontinence and found having some rashes on his back and left forearm.  1.  Cellulitis: We will collect blood culture and give him IV clindamycin and will also gave him lactobacilli tablets orally to prevent Clostridium difficile with clindamycin.  2.  Generalized weakness: This is secondary to his new infection, but will call physical therapy to evaluate his strength.  3.  Uncontrolled hypertension: He is at home not taking any blood pressure medication. We will give low dose of amlodipine to help him control blood pressure better.  4.  History of diabetes, but not taking any medication at home. His blood sugar level is stable, so we will just do insulin checks and sliding scale coverage if it is high.  5.  Hypothyroidism: He is on levothyroxine at home. We will  check TSH level for adequate supplementation.  6.  Depression: He is taking quetiapine, Celexa. We will continue the same.   TOTAL TIME SPENT ON THIS ADMISSION: 50 minutes.    ____________________________ Ceasar Lund Anselm Jungling, MD vgv:jcm D: 07/23/2013 15:59:13 ET T: 07/23/2013 17:02:27 ET JOB#: [id number]  cc: Ceasar Lund. Anselm Jungling, MD, <Dictator> Tracie Harrier, MD Vaughan Basta MD ELECTRONICALLY SIGNED 07/23/2013 18:33

## 2014-08-30 ENCOUNTER — Emergency Department: Payer: Medicare Other

## 2014-08-30 ENCOUNTER — Other Ambulatory Visit: Payer: Self-pay

## 2014-08-30 ENCOUNTER — Emergency Department
Admission: EM | Admit: 2014-08-30 | Discharge: 2014-08-30 | Disposition: A | Discharge status: Home / Self Care | Payer: Medicare Other | Attending: Emergency Medicine | Admitting: Emergency Medicine

## 2014-08-30 ENCOUNTER — Encounter: Payer: Self-pay | Admitting: Emergency Medicine

## 2014-08-30 DIAGNOSIS — F0390 Unspecified dementia without behavioral disturbance: Secondary | ICD-10-CM | POA: Diagnosis not present

## 2014-08-30 DIAGNOSIS — N3000 Acute cystitis without hematuria: Secondary | ICD-10-CM | POA: Diagnosis not present

## 2014-08-30 DIAGNOSIS — Z87891 Personal history of nicotine dependence: Secondary | ICD-10-CM | POA: Diagnosis not present

## 2014-08-30 DIAGNOSIS — Y9389 Activity, other specified: Secondary | ICD-10-CM | POA: Diagnosis not present

## 2014-08-30 DIAGNOSIS — S3992XA Unspecified injury of lower back, initial encounter: Secondary | ICD-10-CM | POA: Insufficient documentation

## 2014-08-30 DIAGNOSIS — Y998 Other external cause status: Secondary | ICD-10-CM | POA: Diagnosis not present

## 2014-08-30 DIAGNOSIS — Y92009 Unspecified place in unspecified non-institutional (private) residence as the place of occurrence of the external cause: Secondary | ICD-10-CM | POA: Diagnosis not present

## 2014-08-30 DIAGNOSIS — M545 Low back pain, unspecified: Secondary | ICD-10-CM

## 2014-08-30 DIAGNOSIS — W1839XA Other fall on same level, initial encounter: Secondary | ICD-10-CM | POA: Insufficient documentation

## 2014-08-30 DIAGNOSIS — Z79899 Other long term (current) drug therapy: Secondary | ICD-10-CM | POA: Insufficient documentation

## 2014-08-30 DIAGNOSIS — W19XXXA Unspecified fall, initial encounter: Secondary | ICD-10-CM

## 2014-08-30 LAB — URINALYSIS COMPLETE WITH MICROSCOPIC (ARMC ONLY)
Bacteria, UA: NONE SEEN
Bilirubin Urine: NEGATIVE
Glucose, UA: NEGATIVE mg/dL
HGB URINE DIPSTICK: NEGATIVE
Ketones, ur: NEGATIVE mg/dL
NITRITE: NEGATIVE
PH: 5 (ref 5.0–8.0)
Protein, ur: 30 mg/dL — AB
Specific Gravity, Urine: 1.023 (ref 1.005–1.030)

## 2014-08-30 LAB — COMPREHENSIVE METABOLIC PANEL
ALK PHOS: 56 U/L (ref 38–126)
ALT: 29 U/L (ref 17–63)
AST: 44 U/L — AB (ref 15–41)
Albumin: 3.6 g/dL (ref 3.5–5.0)
Anion gap: 8 (ref 5–15)
BILIRUBIN TOTAL: 0.8 mg/dL (ref 0.3–1.2)
BUN: 18 mg/dL (ref 6–20)
CHLORIDE: 109 mmol/L (ref 101–111)
CO2: 26 mmol/L (ref 22–32)
Calcium: 8.8 mg/dL — ABNORMAL LOW (ref 8.9–10.3)
Creatinine, Ser: 0.99 mg/dL (ref 0.61–1.24)
GFR calc Af Amer: >60 mL/min (ref >60)
GFR calc non Af Amer: >60 mL/min (ref >60)
Glucose, Bld: 115 mg/dL — ABNORMAL HIGH (ref 65–99)
Potassium: 3.8 mmol/L (ref 3.5–5.1)
Sodium: 143 mmol/L (ref 135–145)
TOTAL PROTEIN: 6.7 g/dL (ref 6.5–8.1)

## 2014-08-30 LAB — TROPONIN I

## 2014-08-30 LAB — CBC
HEMATOCRIT: 37.4 % — AB (ref 40.0–52.0)
HEMOGLOBIN: 12.4 g/dL — AB (ref 13.0–18.0)
MCH: 32.3 pg (ref 26.0–34.0)
MCHC: 33.1 g/dL (ref 32.0–36.0)
MCV: 97.6 fL (ref 80.0–100.0)
Platelets: 179 10*3/uL (ref 150–440)
RBC: 3.83 MIL/uL — ABNORMAL LOW (ref 4.40–5.90)
RDW: 13.3 % (ref 11.5–14.5)
WBC: 9.6 10*3/uL (ref 3.8–10.6)

## 2014-08-30 MED ORDER — NITROFURANTOIN MONOHYD MACRO 100 MG PO CAPS: 100.0000 mg | ORAL_CAPSULE | Freq: Two times a day (BID) | ORAL | Status: AC

## 2014-08-30 MED ORDER — SODIUM CHLORIDE 0.9 % IV SOLN
Freq: Once | INTRAVENOUS | Status: AC
  Administered 2014-08-30: 10:00:00 via INTRAVENOUS

## 2014-08-30 NOTE — Discharge Instructions (Signed)
Urinary Tract Infection Urinary tract infections (UTIs) can develop anywhere along your urinary tract. Your urinary tract is your body's drainage system for removing wastes and extra water. Your urinary tract includes two kidneys, two ureters, a bladder, and a urethra. Your kidneys are a pair of bean-shaped organs. Each kidney is about the size of your fist. They are located below your ribs, one on each side of your spine. CAUSES Infections are caused by microbes, which are microscopic organisms, including fungi, viruses, and bacteria. These organisms are so small that they can only be seen through a microscope. Bacteria are the microbes that most commonly cause UTIs. SYMPTOMS  Symptoms of UTIs may vary by age and gender of the patient and by the location of the infection. Symptoms in young women typically include a frequent and intense urge to urinate and a painful, burning feeling in the bladder or urethra during urination. Older women and men are more likely to be tired, shaky, and weak and have muscle aches and abdominal pain. A fever may mean the infection is in your kidneys. Other symptoms of a kidney infection include pain in your back or sides below the ribs, nausea, and vomiting. DIAGNOSIS To diagnose a UTI, your caregiver will ask you about your symptoms. Your caregiver also will ask to provide a urine sample. The urine sample will be tested for bacteria and white blood cells. White blood cells are made by your body to help fight infection. TREATMENT  Typically, UTIs can be treated with medication. Because most UTIs are caused by a bacterial infection, they usually can be treated with the use of antibiotics. The choice of antibiotic and length of treatment depend on your symptoms and the type of bacteria causing your infection. HOME CARE INSTRUCTIONS  If you were prescribed antibiotics, take them exactly as your caregiver instructs you. Finish the medication even if you feel better after you  have only taken some of the medication.  Drink enough water and fluids to keep your urine clear or pale yellow.  Avoid caffeine, tea, and carbonated beverages. They tend to irritate your bladder.  Empty your bladder often. Avoid holding urine for long periods of time.  Empty your bladder before and after sexual intercourse.  After a bowel movement, women should cleanse from front to back. Use each tissue only once. SEEK MEDICAL CARE IF:   You have back pain.  You develop a fever.  Your symptoms do not begin to resolve within 3 days. SEEK IMMEDIATE MEDICAL CARE IF:   You have severe back pain or lower abdominal pain.  You develop chills.  You have nausea or vomiting.  You have continued burning or discomfort with urination. MAKE SURE YOU:   Understand these instructions.  Will watch your condition.  Will get help right away if you are not doing well or get worse. Document Released: 01/19/2005 Document Revised: 10/11/2011 Document Reviewed: 05/20/2011 [facility] Patient Information 2015 [place], [place]. This information is not intended to replace advice given to you by your health care provider. Make sure you discuss any questions you have with your health care provider.  Lumbosacral Strain Lumbosacral strain is a strain of any of the parts that make up your lumbosacral vertebrae. Your lumbosacral vertebrae are the bones that make up the lower third of your backbone. Your lumbosacral vertebrae are held together by muscles and tough, fibrous tissue (ligaments).  CAUSES  A sudden blow to your back can cause lumbosacral strain. Also, anything that causes an excessive stretch  of the muscles in the low back can cause this strain. This is typically seen when people exert themselves strenuously, fall, lift heavy objects, bend, or crouch repeatedly. RISK FACTORS  Physically demanding work.  Participation in pushing or pulling sports or sports that require a sudden twist of the back  (tennis, golf, baseball).  Weight lifting.  Excessive lower back curvature.  Forward-tilted pelvis.  Weak back or abdominal muscles or both.  Tight hamstrings. SIGNS AND SYMPTOMS  Lumbosacral strain may cause pain in the area of your injury or pain that moves (radiates) down your leg.  DIAGNOSIS Your health care provider can often diagnose lumbosacral strain through a physical exam. In some cases, you may need tests such as X-ray exams.  TREATMENT  Treatment for your lower back injury depends on many factors that your clinician will have to evaluate. However, most treatment will include the use of anti-inflammatory medicines. HOME CARE INSTRUCTIONS   Avoid hard physical activities (tennis, racquetball, waterskiing) if you are not in proper physical condition for it. This may aggravate or create problems.  If you have a back problem, avoid sports requiring sudden body movements. Swimming and walking are generally safer activities.  Maintain good posture.  Maintain a healthy weight.  For acute conditions, you may put ice on the injured area.  Put ice in a plastic bag.  Place a towel between your skin and the bag.  Leave the ice on for 20 minutes, 2-3 times a day.  When the low back starts healing, stretching and strengthening exercises may be recommended. SEEK MEDICAL CARE IF:  Your back pain is getting worse.  You experience severe back pain not relieved with medicines. SEEK IMMEDIATE MEDICAL CARE IF:   You have numbness, tingling, weakness, or problems with the use of your arms or legs.  There is a change in bowel or bladder control.  You have increasing pain in any area of the body, including your belly (abdomen).  You notice shortness of breath, dizziness, or feel faint.  You feel sick to your stomach (nauseous), are throwing up (vomiting), or become sweaty.  You notice discoloration of your toes or legs, or your feet get very cold. MAKE SURE YOU:    Understand these instructions.  Will watch your condition.  Will get help right away if you are not doing well or get worse. Document Released: 01/19/2005 Document Revised: 04/16/2013 Document Reviewed: 11/28/2012 [facility] Patient Information 2015 [place], [place]. This information is not intended to replace advice given to you by your health care provider. Make sure you discuss any questions you have with your health care provider.  Fall Prevention and Home Safety Falls cause injuries and can affect all age groups. It is possible to prevent falls.  HOW TO PREVENT FALLS  Wear shoes with rubber soles that do not have an opening for your toes.  Keep the inside and outside of your house well lit.  Use night lights throughout your home.  Remove clutter from floors.  Clean up floor spills.  Remove throw rugs or fasten them to the floor with carpet tape.  Do not place electrical cords across pathways.  Put grab bars by your tub, shower, and toilet. Do not use towel bars as grab bars.  Put handrails on both sides of the stairway. Fix loose handrails.  Do not climb on stools or stepladders, if possible.  Do not wax your floors.  Repair uneven or unsafe sidewalks, walkways, or stairs.  Keep items you use a  lot within reach.  Be aware of pets.  Keep emergency numbers next to the telephone.  Put smoke detectors in your home and near bedrooms. Ask your doctor what other things you can do to prevent falls. Document Released: 02/05/2009 Document Revised: 10/11/2011 Document Reviewed: 07/12/2011 [facility] Patient Information 2015 [place], [place]. This information is not intended to replace advice given to you by your health care provider. Make sure you discuss any questions you have with your health care provider.

## 2014-08-30 NOTE — ED Notes (Signed)
Waiting for Daughter, Beverlee Nims for transport home.

## 2014-08-30 NOTE — ED Provider Notes (Signed)
[facility] Emergency Department Provider Note  ____________________________________________  Time seen: 9:50 AM  I have reviewed the triage vital signs and the nursing notes.   HISTORY  Chief Complaint Fall   Patient with history of dementia   HPI Barry Coffey is a 81 y.o. male who presents from home via EMS after a fall. Patient lives with daughter found lying on the floor next to his bed this morning. He does have a history of falls. Patient complains of mild low back pain but otherwise feels well. He is able to ambulate on his own. His daughter is concerned that he may have a urinary tract infection given that his urine is dark. No fevers chills. No shortness of breath. No chest pain area    Past Medical History  Diagnosis Date  . Hyperlipidemia   . Dementia   . Gestational diabetes, diet controlled   . Depression   . Anxiety   . Bladder cancer 2011    s/p resection with mitomycin injection;Dr.Stoioff    Patient Active Problem List   Diagnosis Date Noted  . Sinus bradycardia 01/04/2013  . Pre-syncope 04/10/2012    Past Surgical History  Procedure Laterality Date  . Back surgery      x4  . Cholecystectomy    . Foot surgery      Current Outpatient Rx  Name  Route  Sig  Dispense  Refill  . ipratropium (ATROVENT) 0.06 % nasal spray   Nasal   Place 2 sprays into the nose every 8 (eight) hours.         Marland Kitchen levothyroxine (SYNTHROID, LEVOTHROID) 50 MCG tablet   Oral   Take 50 mcg by mouth daily.         Marland Kitchen PARoxetine (PAXIL) 20 MG tablet   Oral   Take 20 mg by mouth every morning.         . pravastatin (PRAVACHOL) 40 MG tablet   Oral   Take 40 mg by mouth daily.           Allergies Review of patient's allergies indicates no known allergies.  Family History  Problem Relation Age of Onset  . Family history unknown: Yes    Social History History  Substance Use Topics  . Smoking status: Former Smoker -- 1.50  packs/day for 25 years    Types: Cigarettes  . Smokeless tobacco: Not on file  . Alcohol Use: No    Review of Systems  Constitutional: Negative for fever. Eyes: Negative for visual changes. ENT: Negative for sore throat. Cardiovascular: Negative for chest pain. Respiratory: Negative for shortness of breath. Gastrointestinal: Negative for abdominal pain, vomiting and diarrhea. Genitourinary: Negative for dysuria. Musculoskeletal: Positive for back pain Skin: Positive for abrasion Neurological: Negative for headaches, focal weakness or numbness.   10-point ROS otherwise negative.  ____________________________________________   PHYSICAL EXAM:  VITAL SIGNS: ED Triage Vitals  Enc Vitals Group     BP 08/30/14 0932 136/71 mmHg     Pulse Rate 08/30/14 0932 62     Resp 08/30/14 0932 17     Temp 08/30/14 0932 98.4 F (36.9 C)     Temp Source 08/30/14 0932 Oral     SpO2 08/30/14 0932 97 %     Weight 08/30/14 0932 180 lb 12.4 oz (82 kg)     Height 08/30/14 0932 5\' 11"  (1.803 m)     Head Cir --      Peak Flow --  Pain Score --      Pain Loc --      Pain Edu? --      Excl. in [place]? --      Constitutional: Alert.. Well appearing and in no distress. Eyes: Conjunctivae are normal. PERRL. Normal extraocular movements. ENT   Head: Normocephalic and atraumatic.   Nose: No congestion/rhinnorhea. Shallow abrasion to bridge of nose, old, healing   Mouth/Throat: Mucous membranes are moist.   Neck: No stridor. No painful range of motion Hematological/Lymphatic/Immunilogical: No cervical lymphadenopathy. Cardiovascular: Normal rate, regular rhythm. Normal and symmetric distal pulses are present in all extremities. No murmurs, rubs, or gallops. Respiratory: Normal respiratory effort without tachypnea nor retractions. Breath sounds are clear and equal bilaterally. No wheezes/rales/rhonchi. Gastrointestinal: Soft and nontender. No distention. There is no CVA  tenderness. Genitourinary: deferred Musculoskeletal: Nontender with normal range of motion in all extremities. No vertebral tenderness to palpation of the thoracic or lumbar spine Neurologic:  Normal speech and language. No gross focal neurologic deficits are appreciated. Speech is normal.  Skin:  Skin is warm, dry. No rash noted. Psychiatric: Mood and affect are normal. Speech and behavior are normal. Patient exhibits appropriate insight and judgment.  ____________________________________________    LABS (pertinent positives/negatives)  Positive for UTI  ____________________________________________   EKG  ED ECG REPORT   Date: 08/30/2014  EKG Time: 10:16 AM  Rate: 56  Rhythm: sinus bradycardia  Axis: Normal  Intervals:none  ST&T Change: Normal   ____________________________________________    RADIOLOGY  X-rays of spine no acute distress  ____________________________________________   PROCEDURES  Procedure(s) performed: None  Critical Care performed: None    ____________________________________________   INITIAL IMPRESSION / ASSESSMENT AND PLAN / ED COURSE  Pertinent labs & imaging results that were available during my care of the patient were reviewed by me and considered in my medical decision making (see chart for details).  Patient overall well-appearing we'll obtain blood work urine x-rays of the spine and reevaluate.  ----------------------------------------- 11:50 AM on 08/30/2014 -----------------------------------------  X-rays unremarkable labs significant for urinary tract infection vitals stable and okay for discharge with antibiotics  ____________________________________________   FINAL CLINICAL IMPRESSION(S) / ED DIAGNOSES  Final diagnoses:  Acute cystitis without hematuria  Fall, initial encounter  Midline low back pain without sciatica     Lavonia Drafts, MD 08/30/14 1150

## 2014-08-30 NOTE — ED Notes (Signed)
Patient to ED from home. Per EMS, patient was found on floor. EMS states that they were able to ambulate the patient without difficulty. Patient denies any pain or discomfort at this time.

## 2014-12-17 ENCOUNTER — Inpatient Hospital Stay: Admit: 2014-12-17 | Payer: Self-pay

## 2014-12-17 ENCOUNTER — Ambulatory Visit
Admission: RE | Admit: 2014-12-17 | Discharge: 2014-12-17 | Disposition: A | Discharge status: Home / Self Care | Payer: Medicare Other | Source: Ambulatory Visit | Attending: Student | Admitting: Student

## 2014-12-17 ENCOUNTER — Encounter: Payer: Self-pay | Admitting: Emergency Medicine

## 2014-12-17 ENCOUNTER — Inpatient Hospital Stay
Admission: EM | Admit: 2014-12-17 | Discharge: 2014-12-21 | DRG: 482 | Disposition: A | Discharge status: Skilled Nursing Facility | Payer: Medicare Other | Attending: Internal Medicine | Admitting: Internal Medicine

## 2014-12-17 ENCOUNTER — Other Ambulatory Visit: Payer: Self-pay | Admitting: Internal Medicine

## 2014-12-17 ENCOUNTER — Other Ambulatory Visit: Payer: Self-pay | Admitting: Student

## 2014-12-17 ENCOUNTER — Emergency Department: Payer: Medicare Other

## 2014-12-17 DIAGNOSIS — J449 Chronic obstructive pulmonary disease, unspecified: Secondary | ICD-10-CM | POA: Diagnosis not present

## 2014-12-17 DIAGNOSIS — S72009A Fracture of unspecified part of neck of unspecified femur, initial encounter for closed fracture: Secondary | ICD-10-CM | POA: Diagnosis present

## 2014-12-17 DIAGNOSIS — Z8551 Personal history of malignant neoplasm of bladder: Secondary | ICD-10-CM | POA: Diagnosis not present

## 2014-12-17 DIAGNOSIS — F329 Major depressive disorder, single episode, unspecified: Secondary | ICD-10-CM | POA: Diagnosis not present

## 2014-12-17 DIAGNOSIS — D649 Anemia, unspecified: Secondary | ICD-10-CM | POA: Diagnosis not present

## 2014-12-17 DIAGNOSIS — E119 Type 2 diabetes mellitus without complications: Secondary | ICD-10-CM | POA: Diagnosis not present

## 2014-12-17 DIAGNOSIS — S50312A Abrasion of left elbow, initial encounter: Secondary | ICD-10-CM | POA: Diagnosis not present

## 2014-12-17 DIAGNOSIS — W010XXA Fall on same level from slipping, tripping and stumbling without subsequent striking against object, initial encounter: Secondary | ICD-10-CM | POA: Diagnosis present

## 2014-12-17 DIAGNOSIS — Y92009 Unspecified place in unspecified non-institutional (private) residence as the place of occurrence of the external cause: Secondary | ICD-10-CM

## 2014-12-17 DIAGNOSIS — R001 Bradycardia, unspecified: Secondary | ICD-10-CM | POA: Diagnosis present

## 2014-12-17 DIAGNOSIS — E785 Hyperlipidemia, unspecified: Secondary | ICD-10-CM | POA: Diagnosis present

## 2014-12-17 DIAGNOSIS — Z8249 Family history of ischemic heart disease and other diseases of the circulatory system: Secondary | ICD-10-CM | POA: Diagnosis not present

## 2014-12-17 DIAGNOSIS — W19XXXA Unspecified fall, initial encounter: Secondary | ICD-10-CM

## 2014-12-17 DIAGNOSIS — F0390 Unspecified dementia without behavioral disturbance: Secondary | ICD-10-CM | POA: Diagnosis present

## 2014-12-17 DIAGNOSIS — Z419 Encounter for procedure for purposes other than remedying health state, unspecified: Secondary | ICD-10-CM

## 2014-12-17 DIAGNOSIS — S72142A Displaced intertrochanteric fracture of left femur, initial encounter for closed fracture: Principal | ICD-10-CM | POA: Diagnosis present

## 2014-12-17 DIAGNOSIS — Z87891 Personal history of nicotine dependence: Secondary | ICD-10-CM

## 2014-12-17 DIAGNOSIS — M5136 Other intervertebral disc degeneration, lumbar region: Secondary | ICD-10-CM | POA: Diagnosis not present

## 2014-12-17 DIAGNOSIS — Z8781 Personal history of (healed) traumatic fracture: Secondary | ICD-10-CM

## 2014-12-17 DIAGNOSIS — Z9889 Other specified postprocedural states: Secondary | ICD-10-CM

## 2014-12-17 HISTORY — DX: Chronic obstructive pulmonary disease, unspecified: J44.9

## 2014-12-17 LAB — CBC WITH DIFFERENTIAL/PLATELET
BASOS PCT: 1 %
BASOS PCT: 0 %
Basophils Absolute: 0 10*3/uL (ref 0–0.1)
Basophils Absolute: 0 10*3/uL (ref 0–0.1)
EOS ABS: 0 10*3/uL (ref 0–0.7)
EOS PCT: 1 %
EOS PCT: 0 %
Eosinophils Absolute: 0 10*3/uL (ref 0–0.7)
HCT: 37.2 % — ABNORMAL LOW (ref 40.0–52.0)
HEMATOCRIT: 37 % — AB (ref 40.0–52.0)
HEMOGLOBIN: 12.1 g/dL — AB (ref 13.0–18.0)
Hemoglobin: 12.3 g/dL — ABNORMAL LOW (ref 13.0–18.0)
Lymphocytes Relative: 9 %
Lymphocytes Relative: 4 %
Lymphs Abs: 0.7 10*3/uL — ABNORMAL LOW (ref 1.0–3.6)
Lymphs Abs: 0.5 10*3/uL — ABNORMAL LOW (ref 1.0–3.6)
MCH: 32.1 pg (ref 26.0–34.0)
MCH: 31.5 pg (ref 26.0–34.0)
MCHC: 33.2 g/dL (ref 32.0–36.0)
MCHC: 32.5 g/dL (ref 32.0–36.0)
MCV: 96.8 fL (ref 80.0–100.0)
MCV: 96.7 fL (ref 80.0–100.0)
MONO ABS: 0.6 10*3/uL (ref 0.2–1.0)
MONOS PCT: 10 %
Monocytes Absolute: 1.3 10*3/uL — ABNORMAL HIGH (ref 0.2–1.0)
Monocytes Relative: 8 %
NEUTROS PCT: 86 %
Neutro Abs: 6.1 10*3/uL (ref 1.4–6.5)
Neutro Abs: 11.3 10*3/uL — ABNORMAL HIGH (ref 1.4–6.5)
Neutrophils Relative %: 81 %
PLATELETS: 182 10*3/uL (ref 150–440)
Platelets: 202 10*3/uL (ref 150–440)
RBC: 3.82 MIL/uL — ABNORMAL LOW (ref 4.40–5.90)
RBC: 3.84 MIL/uL — ABNORMAL LOW (ref 4.40–5.90)
RDW: 13.7 % (ref 11.5–14.5)
RDW: 13.5 % (ref 11.5–14.5)
WBC: 7.4 10*3/uL (ref 3.8–10.6)
WBC: 13.1 10*3/uL — AB (ref 3.8–10.6)

## 2014-12-17 LAB — BASIC METABOLIC PANEL
Anion gap: 7 (ref 5–15)
BUN: 20 mg/dL (ref 6–20)
CO2: 26 mmol/L (ref 22–32)
CREATININE: 1.16 mg/dL (ref 0.61–1.24)
Calcium: 8.8 mg/dL — ABNORMAL LOW (ref 8.9–10.3)
Chloride: 104 mmol/L (ref 101–111)
GFR calc non Af Amer: 55 mL/min — ABNORMAL LOW (ref >60)
GLUCOSE: 127 mg/dL — AB (ref 65–99)
Potassium: 4.3 mmol/L (ref 3.5–5.1)
Sodium: 137 mmol/L (ref 135–145)

## 2014-12-17 LAB — PROTIME-INR
INR: 1.15
Prothrombin Time: 14.9 seconds (ref 11.4–15.0)

## 2014-12-17 LAB — APTT: aPTT: 31 seconds (ref 24–36)

## 2014-12-17 LAB — ALBUMIN: ALBUMIN: 3.7 g/dL (ref 3.5–5.0)

## 2014-12-17 MED ORDER — DONEPEZIL HCL 5 MG PO TABS
10.0000 mg | ORAL_TABLET | Freq: Every day | ORAL | Status: DC
  Administered 2014-12-17 – 2014-12-20 (×4): 10 mg via ORAL
  Filled 2014-12-17 (×4): qty 2

## 2014-12-17 MED ORDER — POLYETHYLENE GLYCOL 3350 17 G PO PACK: 17.0000 g | PACK | Freq: Every day | ORAL | Status: DC | PRN

## 2014-12-17 MED ORDER — CITALOPRAM HYDROBROMIDE 20 MG PO TABS
30.0000 mg | ORAL_TABLET | Freq: Every day | ORAL | Status: DC
  Administered 2014-12-17 – 2014-12-21 (×4): 30 mg via ORAL
  Filled 2014-12-17 (×3): qty 2
  Filled 2014-12-17: qty 1

## 2014-12-17 MED ORDER — VITAMIN D (ERGOCALCIFEROL) 1.25 MG (50000 UNIT) PO CAPS
50000.0000 [IU] | ORAL_CAPSULE | ORAL | Status: DC
  Administered 2014-12-21: 50000 [IU] via ORAL
  Filled 2014-12-17: qty 1

## 2014-12-17 MED ORDER — CEFAZOLIN SODIUM-DEXTROSE 2-3 GM-% IV SOLR
2.0000 g | INTRAVENOUS | Status: AC
  Administered 2014-12-18: 2 g via INTRAVENOUS
  Filled 2014-12-17: qty 50

## 2014-12-17 MED ORDER — LEVOTHYROXINE SODIUM 50 MCG PO TABS
50.0000 ug | ORAL_TABLET | Freq: Every day | ORAL | Status: DC
  Administered 2014-12-19 – 2014-12-21 (×3): 50 ug via ORAL
  Filled 2014-12-17 (×3): qty 1

## 2014-12-17 MED ORDER — ACETAMINOPHEN 325 MG PO TABS
650.0000 mg | ORAL_TABLET | Freq: Four times a day (QID) | ORAL | Status: DC | PRN
  Administered 2014-12-19: 650 mg via ORAL
  Filled 2014-12-17: qty 2

## 2014-12-17 MED ORDER — ACETAMINOPHEN 650 MG RE SUPP: 650.0000 mg | Freq: Four times a day (QID) | RECTAL | Status: DC | PRN

## 2014-12-17 MED ORDER — PRAVASTATIN SODIUM 20 MG PO TABS
40.0000 mg | ORAL_TABLET | Freq: Every evening | ORAL | Status: DC
  Administered 2014-12-17: 40 mg via ORAL
  Administered 2014-12-18: 30 mg via ORAL
  Administered 2014-12-19 – 2014-12-20 (×2): 40 mg via ORAL
  Filled 2014-12-17: qty 2
  Filled 2014-12-17 (×6): qty 1

## 2014-12-17 MED ORDER — OXYCODONE HCL 5 MG PO TABS
5.0000 mg | ORAL_TABLET | ORAL | Status: DC | PRN
  Administered 2014-12-19: 5 mg via ORAL
  Filled 2014-12-17: qty 1

## 2014-12-17 MED ORDER — MORPHINE SULFATE (PF) 2 MG/ML IV SOLN
2.0000 mg | INTRAVENOUS | Status: DC | PRN
  Administered 2014-12-20: 2 mg via INTRAVENOUS
  Filled 2014-12-17: qty 1

## 2014-12-17 MED ORDER — TETANUS-DIPHTH-ACELL PERTUSSIS 5-2.5-18.5 LF-MCG/0.5 IM SUSP
0.5000 mL | Freq: Once | INTRAMUSCULAR | Status: AC
  Administered 2014-12-17: 0.5 mL via INTRAMUSCULAR
  Filled 2014-12-17: qty 0.5

## 2014-12-17 MED ORDER — QUETIAPINE FUMARATE 25 MG PO TABS
50.0000 mg | ORAL_TABLET | Freq: Every day | ORAL | Status: DC
  Administered 2014-12-17 – 2014-12-20 (×4): 50 mg via ORAL
  Filled 2014-12-17 (×4): qty 2

## 2014-12-17 NOTE — ED Provider Notes (Signed)
[facility] Emergency Department Provider Note  ____________________________________________  Time seen: On EMS arrival  I have reviewed the triage vital signs and the nursing notes.   HISTORY  Chief Complaint No chief complaint on file.    HPI Barry Coffey is a 81 y.o. male with history of dementia, diabetes, depression and anxiety who presents for evaluation of sudden onset traumatic left hip pain which occurred just prior to arrival. Patient was taking out the trash when he slipped on a bag. He does not think he hit his head but is not sure. He does not think he lost consciousness. Currently his pain is moderate. It is worse with movement of the left hip. He denies any other pain complaints at this time. Prior to today, he had been in his usual state of health.   Past Medical History  Diagnosis Date  . Hyperlipidemia   . Dementia   . Gestational diabetes, diet controlled   . Depression   . Anxiety   . Bladder cancer 2011    s/p resection with mitomycin injection;Dr.Stoioff    Patient Active Problem List   Diagnosis Date Noted  . Sinus bradycardia 01/04/2013  . Pre-syncope 04/10/2012    Past Surgical History  Procedure Laterality Date  . Back surgery      x4  . Cholecystectomy    . Foot surgery      Current Outpatient Rx  Name  Route  Sig  Dispense  Refill  . ipratropium (ATROVENT) 0.06 % nasal spray   Nasal   Place 2 sprays into the nose every 8 (eight) hours.         Marland Kitchen levothyroxine (SYNTHROID, LEVOTHROID) 50 MCG tablet   Oral   Take 50 mcg by mouth daily.         Marland Kitchen PARoxetine (PAXIL) 20 MG tablet   Oral   Take 20 mg by mouth every morning.         . pravastatin (PRAVACHOL) 40 MG tablet   Oral   Take 40 mg by mouth daily.           Allergies Review of patient's allergies indicates no known allergies.  Family History  Problem Relation Age of Onset  . Family history unknown: Yes    Social History Social  History  Substance Use Topics  . Smoking status: Former Smoker -- 1.50 packs/day for 25 years    Types: Cigarettes  . Smokeless tobacco: Not on file  . Alcohol Use: No    Review of Systems Constitutional: No fever/chills Eyes: No visual changes. ENT: No sore throat. Cardiovascular: Denies chest pain. Respiratory: Denies shortness of breath. Gastrointestinal: No abdominal pain.  No nausea, no vomiting.  No diarrhea.  No constipation. Genitourinary: Negative for dysuria. Musculoskeletal: Negative for back pain. Skin: Negative for rash. Neurological: Negative for headaches, focal weakness or numbness.  10-point ROS otherwise negative.  ____________________________________________   PHYSICAL EXAM:  Filed Vitals:   12/17/14 1547  BP: 122/73  Pulse: 57  Temp: 98.3 F (36.8 C)  Resp: 18  SpO2: 96%       Constitutional: Alert and oriented to self and place but not year. Well appearing and in no acute distress. Eyes: Conjunctivae are normal. PERRL. EOMI. Head: Atraumatic.  No midline C-spine tenderness to palpation.  Nose: No congestion/rhinnorhea. Mouth/Throat: Mucous membranes are moist.  Oropharynx non-erythematous. Neck: No stridor.  Cardiovascular: Normal rate, regular rhythm. Grossly normal heart sounds.  Good peripheral circulation. Respiratory: Normal respiratory effort.  No retractions. Lungs CTAB. Gastrointestinal: Soft and nontender. No distention. No abdominal bruits. No CVA tenderness. Genitourinary: deferred Musculoskeletal: No midline tenderness throughout the T or L-spine. Tenderness throughout the left hip/groin with shortening of the left leg. 2+ left DP pulse, wiggles the toes.  Neurologic:  Normal speech and language. No gross focal neurologic deficits are appreciated.  Skin:  Skin is warm, dry. No rash noted. Bruising with superficial skin tear associated with the left elbow however the patient has full painless range of motion, no bony tenderness or  swelling. Psychiatric: Mood and affect are normal. Speech and behavior are normal.  ____________________________________________   LABS (all labs ordered are listed, but only abnormal results are displayed)  Labs Reviewed  CBC WITH DIFFERENTIAL/PLATELET  BASIC METABOLIC PANEL  PROTIME-INR  APTT   ____________________________________________  EKG  Screening/pre-op EKG pending at time of admission  ____________________________________________  RADIOLOGY  Left hip xray FINDINGS: Osseous demineralization.  Hip and SI joint spaces preserved and symmetric.  Minimally distracted intertrochanteric fracture LEFT femur.  No additional fracture, dislocation or bone destruction identified.  Degenerative disc disease changes at visualized lower lumbar spine.  IMPRESSION: Intertrochanteric fracture LEFT femur.   CXR IMPRESSION: No acute cardiopulmonary process.  CT head IMPRESSION: No acute intracranial abnormality. ____________________________________________   PROCEDURES  Procedure(s) performed: None  Critical Care performed: No  ____________________________________________   INITIAL IMPRESSION / ASSESSMENT AND PLAN / ED COURSE  Pertinent labs & imaging results that were available during my care of the patient were reviewed by me and considered in my medical decision making (see chart for details).  Barry Coffey is a 81 y.o. male with history of dementia, diabetes, depression and anxiety who presents for evaluation of sudden onset traumatic left hip pain which occurred just prior to arrival. On exam, he is well-appearing until he will lose the left leg which causes him pain. He is neurovascular intact in the left lower extremity. Plain films confirm left intertrochanteric femur fracture. Discussed with Dr. Mack Guise who will see the patient and recommended hospitalist admission. Labs pending. Case discussed with hospitalist at 5:35 pmfor  admission. ____________________________________________   FINAL CLINICAL IMPRESSION(S) / ED DIAGNOSES  Final diagnoses:  None      Joanne Gavel, MD 12/17/14 2354

## 2014-12-17 NOTE — H&P (Signed)
[place] [place] NAME: Barry Coffey    MR#:  [id number]  DATE OF BIRTH:  11-05-1925  DATE OF ADMISSION:  12/17/2014  PRIMARY CARE PHYSICIAN: Tracie Harrier, MD   REQUESTING/REFERRING PHYSICIAN: Dr. Girard Cooter  CHIEF COMPLAINT:   Chief Complaint  Patient presents with  . Fall    HISTORY OF PRESENT ILLNESS:  Barry Coffey  is a 81 y.o. male with a known history of mild dementia, hyperlipidemia, depression and anxiety and COPD not on any home oxygen presents to the hospital from home after a fall and left hip pain. Most of the history is obtained from daughter-in-law at bedside. Patient is usually not very mobile at home according to them. He has a cane which she is not using much. He only takes the trash out and walks to the mailbox mostly. Today was getting the trash out he slipped and fell. Denies any chest pain, no syncopal episode, no loss of consciousness. Patient does not have any cardiac history. No dyspnea on exertion. Left leg is abducted and laterally rotated and he is complaining of pain there. Labs are still pending. EKG showing sinus bradycardia without any acute changes.  PAST MEDICAL HISTORY:   Past Medical History  Diagnosis Date  . Hyperlipidemia   . Dementia   . Depression   . Anxiety   . Bladder cancer 2011    s/p resection with mitomycin injection;Dr.Stoioff  . COPD (chronic obstructive pulmonary disease)     PAST SURGICAL HISTORY:   Past Surgical History  Procedure Laterality Date  . Back surgery      x4  . Cholecystectomy    . Foot surgery      SOCIAL HISTORY:   Social History  Substance Use Topics  . Smoking status: Former Smoker -- 1.50 packs/day for 25 years    Types: Cigarettes  . Smokeless tobacco: Not on file  . Alcohol Use: No    FAMILY HISTORY:   Family History  Problem Relation Age of Onset  . Hypertension Father     DRUG ALLERGIES:  No Known Allergies  REVIEW  OF SYSTEMS:   Review of Systems  Constitutional: Negative for fever, chills, weight loss and malaise/fatigue.  HENT: Negative for ear discharge, ear pain, hearing loss, nosebleeds and tinnitus.   Eyes: Negative for blurred vision, double vision and photophobia.  Respiratory: Negative for cough, hemoptysis, shortness of breath and wheezing.   Cardiovascular: Negative for chest pain, palpitations, orthopnea and leg swelling.  Gastrointestinal: Negative for heartburn, nausea, vomiting, abdominal pain, diarrhea, constipation and melena.  Genitourinary: Negative for dysuria, urgency, frequency and hematuria.  Musculoskeletal: Positive for joint pain. Negative for myalgias, back pain and neck pain.       Left hip pain  Skin: Negative for rash.  Neurological: Negative for dizziness, tingling, tremors, sensory change, speech change, focal weakness and headaches.  Endo/Heme/Allergies: Does not bruise/bleed easily.  Psychiatric/Behavioral: Negative for depression.    MEDICATIONS AT HOME:   Prior to Admission medications   Medication Sig Start Date End Date Taking? Authorizing Provider  citalopram (CELEXA) 10 MG tablet Take 30 mg by mouth daily.   Yes Historical Provider, MD  donepezil (ARICEPT) 10 MG tablet Take 10 mg by mouth at bedtime.   Yes Historical Provider, MD  levothyroxine (SYNTHROID, LEVOTHROID) 50 MCG tablet Take 50 mcg by mouth daily before breakfast.    Yes Historical Provider, MD  pravastatin (PRAVACHOL) 40 MG tablet Take 40 mg by mouth  every evening.    Yes Historical Provider, MD  QUEtiapine (SEROQUEL) 25 MG tablet Take 50 mg by mouth at bedtime.   Yes Historical Provider, MD  Vitamin D, Ergocalciferol, (DRISDOL) 50000 UNITS CAPS capsule Take 50,000 Units by mouth every 7 (seven) days. Pt takes on Sunday.   Yes Historical Provider, MD      VITAL SIGNS:  Blood pressure 122/73, pulse 57, temperature 98.3 F (36.8 C), resp. rate 18, SpO2 96 %.  PHYSICAL EXAMINATION:    Physical Exam  GENERAL:  81 y.o.-year-old patient lying in the bed with no acute distress.  EYES: Pupils equal, round, reactive to light and accommodation. No scleral icterus. Extraocular muscles intact.  HEENT: Head atraumatic, normocephalic. Oropharynx and nasopharynx clear.  NECK:  Supple, no jugular venous distention. No thyroid enlargement, no tenderness.  LUNGS: Normal breath sounds bilaterally, no wheezing, rales,rhonchi or crepitation. No use of accessory muscles of respiration. Decreased bibasilar breath sounds. CARDIOVASCULAR: S1, S2 normal. No murmurs, rubs, or gallops.  ABDOMEN: Soft, nontender, nondistended. Bowel sounds present. No organomegaly or mass.  EXTREMITIES: Left hip is abducted and externally rotated. No obvious bruising noted at the site of fracture. No pedal edema. Dorsalis pedis pulses are palpable bilaterally. No cyanosis or clubbing.  NEUROLOGIC: Cranial nerves II through XII are intact. Muscle strength 5/5 in all extremities except in left leg which is not tested due to his pain and fracture. Sensation intact. Gait not checked.  PSYCHIATRIC: The patient is alert and oriented x 3.  SKIN: No obvious rash, lesion, or ulcer.   LABORATORY PANEL:   CBC No results for input(s): WBC, HGB, HCT, PLT in the last 168 hours. ------------------------------------------------------------------------------------------------------------------  Chemistries  No results for input(s): NA, K, CL, CO2, GLUCOSE, BUN, CREATININE, CALCIUM, MG, AST, ALT, ALKPHOS, BILITOT in the last 168 hours.  Invalid input(s): GFRCGP ------------------------------------------------------------------------------------------------------------------  Cardiac Enzymes No results for input(s): TROPONINI in the last 168 hours. ------------------------------------------------------------------------------------------------------------------  RADIOLOGY:  Dg Chest 1 View  12/17/2014   CLINICAL DATA:   Fall today with LEFT hip pain.  LEFT hip fracture.  EXAM: CHEST  1 VIEW  COMPARISON:  Radiograph 07/23/2013  FINDINGS: Normal cardiac silhouette with ectatic aorta. No effusion, infiltrate, or pneumothorax.  IMPRESSION: No acute cardiopulmonary process.   Electronically Signed   By: Suzy Bouchard M.D.   On: 12/17/2014 17:06   Ct Head Wo Contrast  12/17/2014   CLINICAL DATA:  [address] and hit head today.  Headache.  EXAM: CT HEAD WITHOUT CONTRAST  TECHNIQUE: Contiguous axial images were obtained from the base of the skull through the vertex without intravenous contrast.  COMPARISON:  CT head 07/23/2013  FINDINGS: Mild to moderate generalized atrophy. Mild chronic microvascular ischemic change in the white matter  Negative for acute infarct.  Negative for hemorrhage or mass  Chronic depressed fracture right zygomatic arch is unchanged. No acute skull fracture.  IMPRESSION: No acute intracranial abnormality.   Electronically Signed   By: Franchot Gallo M.D.   On: 12/17/2014 17:25   Dg Hip Unilat With Pelvis 2-3 Views Left  12/17/2014   CLINICAL DATA:  LEFT hip pain post fall  EXAM: DG HIP (WITH OR WITHOUT PELVIS) 2-3V LEFT  COMPARISON:  None  FINDINGS: Osseous demineralization.  Hip and SI joint spaces preserved and symmetric.  Minimally distracted intertrochanteric fracture LEFT femur.  No additional fracture, dislocation or bone destruction identified.  Degenerative disc disease changes at visualized lower lumbar spine.  IMPRESSION: Intertrochanteric fracture LEFT femur.   Electronically Signed  By: Lavonia Dana M.D.   On: 12/17/2014 17:06    EKG:   Orders placed or performed during the hospital encounter of 08/30/14  . ED EKG  . ED EKG  . EKG    IMPRESSION AND PLAN:   Barry Coffey  is a 81 y.o. male with a known history of mild dementia, hyperlipidemia, depression and anxiety and COPD not on any home oxygen presents to the hospital from home after a fall and left hip pain.  #1 left hip  fracture-preop evaluation - Left hip intertrochanteric fracture. -Patient is stable cardiac-wise, EKG shows no acute changes and chest x-ray is stable. -Low risk for cardiac surgery. Can proceed with the surgery. -Labs are still pending so we'll make sure electrolytes are okay. -Orthopedic consult at. -Pain management. -Postoperative DVT prophylaxis and physical therapy will be needed.  #2 hyperlipidemia-continue statin  #3 dementia with depression-continue Aricept, Seroquel, Celexa. -His Mini-Mental score exam was 27/30 recently at PCPs office. But possible delirium might occur in the hospital according to family.  #4 DVT prophylaxis-for surgery tomorrow. Teds and SCDs to the right leg.  All the records are reviewed and case discussed with ED provider. Management plans discussed with the patient, family and they are in agreement.  CODE STATUS: FULL CODE  TOTAL TIME TAKING CARE OF THIS PATIENT: 50 minutes.    Gladstone Lighter M.D on 12/17/2014 at 6:04 PM  Between 7am to 6pm - Pager - [phone]  After 6pm go to [email or web address] - password [email or web address] [facility]  Office  [phone]  CC: Primary care physician; Tracie Harrier, MD

## 2014-12-17 NOTE — ED Notes (Signed)
Patient comes into the ED via EMS c/o left hip pain.  Patient was taking the trash out and slipped on a bag.  Patient has noticeable lateral rotation and shortening of the left leg.  Abrasions noted to the left elbow and hand.  Patient has hx of dementia and COPD.  142/78, 68HR, 98% room air.

## 2014-12-17 NOTE — ED Notes (Signed)
Attempted to call report for patient to be moved to ortho floor.  Nurse in the middle of report and will return my call.

## 2014-12-17 NOTE — ED Notes (Signed)
Attempted for second time to give report.  Nurse still unavailable.  Will return my call.

## 2014-12-17 NOTE — Consult Note (Signed)
ORTHOPAEDIC CONSULTATION  REQUESTING PHYSICIAN: Tracie Harrier, MD  Chief Complaint: Left hip pain status post fall  HPI: Barry Coffey is a 81 y.o. male who complains of  left-sided hip pain after falling in his garage today at home. Patient states he tripped over an object on the garage floor causing him to fall. He is brought to the [facility] emergency Department where x-rays were taken and he was diagnosed with a left intertrochanteric hip fracture.  Past Medical History  Diagnosis Date  . Hyperlipidemia   . Dementia   . Depression   . Anxiety   . Bladder cancer 2011    s/p resection with mitomycin injection;Dr.Stoioff  . COPD (chronic obstructive pulmonary disease)    Past Surgical History  Procedure Laterality Date  . Back surgery      x4  . Cholecystectomy    . Foot surgery     Social History   Social History  . Marital Status: Married    Spouse Name: N/A  . Number of Children: N/A  . Years of Education: N/A   Social History Main Topics  . Smoking status: Former Smoker -- 1.50 packs/day for 25 years    Types: Cigarettes  . Smokeless tobacco: None  . Alcohol Use: No  . Drug Use: No  . Sexual Activity: Not Asked   Other Topics Concern  . None   Social History Narrative   Lives at home with son and family. Walks with a cane   Family History  Problem Relation Age of Onset  . Hypertension Father    No Known Allergies Prior to Admission medications   Medication Sig Start Date End Date Taking? Authorizing Provider  citalopram (CELEXA) 10 MG tablet Take 30 mg by mouth daily.   Yes Historical Provider, MD  donepezil (ARICEPT) 10 MG tablet Take 10 mg by mouth at bedtime.   Yes Historical Provider, MD  levothyroxine (SYNTHROID, LEVOTHROID) 50 MCG tablet Take 50 mcg by mouth daily before breakfast.    Yes Historical Provider, MD  pravastatin (PRAVACHOL) 40 MG tablet Take 40 mg by mouth every evening.    Yes Historical Provider, MD  QUEtiapine  (SEROQUEL) 25 MG tablet Take 50 mg by mouth at bedtime.   Yes Historical Provider, MD  Vitamin D, Ergocalciferol, (DRISDOL) 50000 UNITS CAPS capsule Take 50,000 Units by mouth every 7 (seven) days. Pt takes on Sunday.   Yes Historical Provider, MD   Dg Chest 1 View  12/17/2014   CLINICAL DATA:  Fall today with LEFT hip pain.  LEFT hip fracture.  EXAM: CHEST  1 VIEW  COMPARISON:  Radiograph 07/23/2013  FINDINGS: Normal cardiac silhouette with ectatic aorta. No effusion, infiltrate, or pneumothorax.  IMPRESSION: No acute cardiopulmonary process.   Electronically Signed   By: Suzy Bouchard M.D.   On: 12/17/2014 17:06   Ct Head Wo Contrast  12/17/2014   CLINICAL DATA:  [address] and hit head today.  Headache.  EXAM: CT HEAD WITHOUT CONTRAST  TECHNIQUE: Contiguous axial images were obtained from the base of the skull through the vertex without intravenous contrast.  COMPARISON:  CT head 07/23/2013  FINDINGS: Mild to moderate generalized atrophy. Mild chronic microvascular ischemic change in the white matter  Negative for acute infarct.  Negative for hemorrhage or mass  Chronic depressed fracture right zygomatic arch is unchanged. No acute skull fracture.  IMPRESSION: No acute intracranial abnormality.   Electronically Signed   By: Franchot Gallo M.D.   On: 12/17/2014 17:25  Dg Hip Unilat With Pelvis 2-3 Views Left  12/17/2014   CLINICAL DATA:  LEFT hip pain post fall  EXAM: DG HIP (WITH OR WITHOUT PELVIS) 2-3V LEFT  COMPARISON:  None  FINDINGS: Osseous demineralization.  Hip and SI joint spaces preserved and symmetric.  Minimally distracted intertrochanteric fracture LEFT femur.  No additional fracture, dislocation or bone destruction identified.  Degenerative disc disease changes at visualized lower lumbar spine.  IMPRESSION: Intertrochanteric fracture LEFT femur.   Electronically Signed   By: Lavonia Dana M.D.   On: 12/17/2014 17:06    Positive ROS: All other systems have been reviewed and were otherwise  negative with the exception of those mentioned in the HPI and as above.  Physical Exam: General: Alert, no acute distress  MUSCULOSKELETAL: Left lower extremity: Patient's skin is intact. There is no erythema or ecchymosis. His thigh compartments are soft and compressible as are his leg compartments. He has intact sensation light touch in the lower extremities. He can flex and extend his toes bilaterally. He has palpable pedal pulses. His left hip is slightly externally rotated without significant shortening.  Assessment: Left intertrochanteric hip fracture  Plan: Patient has a history of dementia and therefore was not able to provide an accurate history. I spoke with his daughter in law, Beverlee Nims, by phone this evening. I explained to her the injury and the recommended intramedullary fixation for his left hip fracture. She agreed with the plan for surgery. I also explained to the patient that surgery will be required to fix his left hip fracture. I reviewed all the patient's labs and radiographic studies in preparation for this case.  He has been cleared for surgery by Dr. Tressia Miners from the hospitalist service.  Plan for surgery tomorrow. Patient will remain nothing by mouth after midnight. Labs will be redrawn in the morning. Continue current pain management.   Thornton Park, MD    12/17/2014 11:16 PM

## 2014-12-18 ENCOUNTER — Inpatient Hospital Stay: Payer: Medicare Other

## 2014-12-18 ENCOUNTER — Inpatient Hospital Stay: Payer: Medicare Other | Admitting: Anesthesiology

## 2014-12-18 ENCOUNTER — Encounter
Admission: EM | Disposition: A | Discharge status: Skilled Nursing Facility | Payer: Self-pay | Source: Home / Self Care | Attending: Internal Medicine

## 2014-12-18 ENCOUNTER — Encounter: Payer: Self-pay | Admitting: Anesthesiology

## 2014-12-18 HISTORY — PX: FEMUR IM NAIL: SHX1597

## 2014-12-18 LAB — BASIC METABOLIC PANEL
ANION GAP: 8 (ref 5–15)
BUN: 19 mg/dL (ref 6–20)
CHLORIDE: 105 mmol/L (ref 101–111)
CO2: 24 mmol/L (ref 22–32)
Calcium: 8.5 mg/dL — ABNORMAL LOW (ref 8.9–10.3)
Creatinine, Ser: 1.04 mg/dL (ref 0.61–1.24)
GFR calc non Af Amer: >60 mL/min (ref >60)
GLUCOSE: 111 mg/dL — AB (ref 65–99)
POTASSIUM: 3.9 mmol/L (ref 3.5–5.1)
Sodium: 137 mmol/L (ref 135–145)

## 2014-12-18 LAB — CBC
HEMATOCRIT: 33.5 % — AB (ref 40.0–52.0)
HEMOGLOBIN: 11.2 g/dL — AB (ref 13.0–18.0)
MCH: 32.2 pg (ref 26.0–34.0)
MCHC: 33.5 g/dL (ref 32.0–36.0)
MCV: 96 fL (ref 80.0–100.0)
Platelets: 161 10*3/uL (ref 150–440)
RBC: 3.49 MIL/uL — AB (ref 4.40–5.90)
RDW: 13.5 % (ref 11.5–14.5)
WBC: 9 10*3/uL (ref 3.8–10.6)

## 2014-12-18 LAB — TYPE AND SCREEN
ABO/RH(D): AB POS
Antibody Screen: NEGATIVE

## 2014-12-18 LAB — PROTIME-INR
INR: 1.27
PROTHROMBIN TIME: 16.1 s — AB (ref 11.4–15.0)

## 2014-12-18 LAB — GLUCOSE, CAPILLARY: GLUCOSE-CAPILLARY: 112 mg/dL — AB (ref 65–99)

## 2014-12-18 LAB — SURGICAL PCR SCREEN
MRSA, PCR: NEGATIVE
Staphylococcus aureus: NEGATIVE

## 2014-12-18 SURGERY — INSERTION, INTRAMEDULLARY ROD, FEMUR
Anesthesia: Spinal | Site: Hip | Laterality: Left | Wound class: Clean

## 2014-12-18 MED ORDER — EPHEDRINE SULFATE 50 MG/ML IJ SOLN
INTRAMUSCULAR | Status: DC | PRN
  Administered 2014-12-18: 5 mg via INTRAVENOUS
  Administered 2014-12-18: 10 mg via INTRAVENOUS

## 2014-12-18 MED ORDER — DOCUSATE SODIUM 100 MG PO CAPS
100.0000 mg | ORAL_CAPSULE | Freq: Two times a day (BID) | ORAL | Status: DC
  Administered 2014-12-18 – 2014-12-21 (×6): 100 mg via ORAL
  Filled 2014-12-18 (×6): qty 1

## 2014-12-18 MED ORDER — PROPOFOL 10 MG/ML IV BOLUS
INTRAVENOUS | Status: DC | PRN
  Administered 2014-12-18: 30 mg via INTRAVENOUS

## 2014-12-18 MED ORDER — MAGNESIUM HYDROXIDE 400 MG/5ML PO SUSP: 30.0000 mL | Freq: Every day | ORAL | Status: DC | PRN

## 2014-12-18 MED ORDER — ALUM & MAG HYDROXIDE-SIMETH 200-200-20 MG/5ML PO SUSP: 30.0000 mL | ORAL | Status: DC | PRN

## 2014-12-18 MED ORDER — FENTANYL CITRATE (PF) 100 MCG/2ML IJ SOLN
INTRAMUSCULAR | Status: DC | PRN
  Administered 2014-12-18 (×2): 25 ug via INTRAVENOUS

## 2014-12-18 MED ORDER — MENTHOL 3 MG MT LOZG
1.0000 | LOZENGE | OROMUCOSAL | Status: DC | PRN
  Filled 2014-12-18: qty 9

## 2014-12-18 MED ORDER — PHENOL 1.4 % MT LIQD: 1.0000 | OROMUCOSAL | Status: DC | PRN

## 2014-12-18 MED ORDER — FENTANYL CITRATE (PF) 100 MCG/2ML IJ SOLN: 25.0000 ug | INTRAMUSCULAR | Status: DC | PRN

## 2014-12-18 MED ORDER — ONDANSETRON HCL 4 MG PO TABS: 4.0000 mg | ORAL_TABLET | Freq: Four times a day (QID) | ORAL | Status: DC | PRN

## 2014-12-18 MED ORDER — LACTATED RINGERS IV SOLN
INTRAVENOUS | Status: DC | PRN
  Administered 2014-12-18: 10:00:00 via INTRAVENOUS

## 2014-12-18 MED ORDER — GLYCOPYRROLATE 0.2 MG/ML IJ SOLN
INTRAMUSCULAR | Status: DC | PRN
  Administered 2014-12-18: 0.2 mg via INTRAVENOUS

## 2014-12-18 MED ORDER — PHENYLEPHRINE HCL 10 MG/ML IJ SOLN
INTRAMUSCULAR | Status: DC | PRN
  Administered 2014-12-18 (×3): 50 ug via INTRAVENOUS
  Administered 2014-12-18 (×2): 100 ug via INTRAVENOUS

## 2014-12-18 MED ORDER — ONDANSETRON HCL 4 MG/2ML IJ SOLN: 4.0000 mg | Freq: Four times a day (QID) | INTRAMUSCULAR | Status: DC | PRN

## 2014-12-18 MED ORDER — FERROUS SULFATE 325 (65 FE) MG PO TABS
325.0000 mg | ORAL_TABLET | Freq: Three times a day (TID) | ORAL | Status: DC
Start: 2014-12-18 — End: 2014-12-21
  Administered 2014-12-18 – 2014-12-21 (×9): 325 mg via ORAL
  Filled 2014-12-18 (×9): qty 1

## 2014-12-18 MED ORDER — PROPOFOL INFUSION 10 MG/ML OPTIME
INTRAVENOUS | Status: DC | PRN
  Administered 2014-12-18: 25 ug/kg/min via INTRAVENOUS

## 2014-12-18 MED ORDER — ONDANSETRON HCL 4 MG/2ML IJ SOLN: 4.0000 mg | Freq: Once | INTRAMUSCULAR | Status: DC | PRN

## 2014-12-18 MED ORDER — ENOXAPARIN SODIUM 30 MG/0.3ML ~~LOC~~ SOLN
30.0000 mg | Freq: Two times a day (BID) | SUBCUTANEOUS | Status: DC
  Administered 2014-12-19 – 2014-12-21 (×5): 30 mg via SUBCUTANEOUS
  Filled 2014-12-18 (×5): qty 0.3

## 2014-12-18 MED ORDER — MAGNESIUM CITRATE PO SOLN: 1.0000 | Freq: Once | ORAL | Status: DC | PRN

## 2014-12-18 MED ORDER — NEOMYCIN-POLYMYXIN B GU 40-200000 IR SOLN
Status: DC | PRN
  Administered 2014-12-18: 4 mL

## 2014-12-18 MED ORDER — CEFAZOLIN SODIUM 1-5 GM-% IV SOLN
1.0000 g | Freq: Four times a day (QID) | INTRAVENOUS | Status: AC
  Administered 2014-12-18 – 2014-12-19 (×3): 1 g via INTRAVENOUS
  Filled 2014-12-18 (×3): qty 50

## 2014-12-18 MED ORDER — POTASSIUM CHLORIDE IN NACL 20-0.9 MEQ/L-% IV SOLN
INTRAVENOUS | Status: DC
  Administered 2014-12-18: 15:00:00 via INTRAVENOUS
  Filled 2014-12-18 (×5): qty 1000

## 2014-12-18 MED ORDER — BISACODYL 10 MG RE SUPP: 10.0000 mg | Freq: Every day | RECTAL | Status: DC | PRN

## 2014-12-18 SURGICAL SUPPLY — 32 items
BIT DRILL CANN LG 4.3MM (BIT) ×1 IMPLANT
BNDG COHESIVE 6X5 TAN STRL LF (GAUZE/BANDAGES/DRESSINGS) ×2 IMPLANT
CANISTER SUCT 1200ML W/VALVE (MISCELLANEOUS) ×2 IMPLANT
DRAPE SURG 17X11 SM STRL (DRAPES) ×2 IMPLANT
DRAPE U-SHAPE 47X51 STRL (DRAPES) ×2 IMPLANT
DRILL BIT CANN LG 4.3MM (BIT) ×2
DRSG OPSITE POSTOP 3X4 (GAUZE/BANDAGES/DRESSINGS) ×2 IMPLANT
DRSG OPSITE POSTOP 4X14 (GAUZE/BANDAGES/DRESSINGS) ×2 IMPLANT
DRSG OPSITE POSTOP 4X6 (GAUZE/BANDAGES/DRESSINGS) ×2 IMPLANT
DURAPREP 26ML APPLICATOR (WOUND CARE) ×2 IMPLANT
GLOVE BIOGEL PI IND STRL 9 (GLOVE) ×1 IMPLANT
GLOVE BIOGEL PI INDICATOR 9 (GLOVE) ×1
GLOVE SURG 9.0 ORTHO LTXF (GLOVE) ×2 IMPLANT
GOWN STRL REUS TWL 2XL XL LVL4 (GOWN DISPOSABLE) ×2 IMPLANT
GOWN STRL REUS W/ TWL LRG LVL3 (GOWN DISPOSABLE) ×1 IMPLANT
GOWN STRL REUS W/TWL LRG LVL3 (GOWN DISPOSABLE) ×1
GUIDEPIN VERSANAIL DSP 3.2X444 ×2 IMPLANT
HEMOVAC 400CC 10FR (MISCELLANEOUS) ×2 IMPLANT
KIT RM TURNOVER CYSTO AR (KITS) ×2 IMPLANT
MAT BLUE FLOOR 46X72 FLO (MISCELLANEOUS) ×2 IMPLANT
NAIL HIP FRACT 130D 11X180 (Screw) ×2 IMPLANT
NS IRRIG 1000ML POUR BTL (IV SOLUTION) ×2 IMPLANT
PACK HIP COMPR (MISCELLANEOUS) ×2 IMPLANT
PAD GROUND ADULT SPLIT (MISCELLANEOUS) ×2 IMPLANT
SCREW BONE CORTICAL 5.0X44 (Screw) ×2 IMPLANT
SCREW LAG 10.5MMX105MM HFN (Screw) ×2 IMPLANT
STAPLER SKIN PROX 35W (STAPLE) ×2 IMPLANT
SUCTION FRAZIER TIP 10 FR DISP (SUCTIONS) ×2 IMPLANT
SUT VIC AB 2-0 CT1 27 (SUTURE) ×1
SUT VIC AB 2-0 CT1 TAPERPNT 27 (SUTURE) ×1 IMPLANT
SUT VICRYL 0 AB UR-6 (SUTURE) ×2 IMPLANT
SYR 30ML LL (SYRINGE) ×2 IMPLANT

## 2014-12-18 NOTE — Progress Notes (Signed)
PROGRESS NOTE  Barry Coffey MOQ:[id number] DOB: 1925-09-03 DOA: 12/17/2014 PCP: Tracie Harrier, MD  Subjective 81 y/o male with hx of Depression, Dementia, hyperlipidemia, COPD admitted with  Left Hip inter trochantric fracture after a fall Underwent ORIF earlier today Consultants:  Ortho;Dr.Krasinski   Objective: BP 102/51 mmHg  Pulse 70  Temp(Src) 97.9 F (36.6 C) (Oral)  Resp 18  Wt 75.025 kg (165 lb 6.4 oz)  SpO2 95%  Intake/Output Summary (Last 24 hours) at 12/18/14 1423 Last data filed at 12/18/14 1306  Gross per 24 hour  Intake    900 ml  Output    750 ml  Net    150 ml   Filed Weights   12/17/14 2044  Weight: 75.025 kg (165 lb 6.4 oz)    Exam:  General: Not in distress HEENT: PERRL; OP moist without lesions. Neck: supple, trachea midline, no thyromegaly Chest: normal to palpation Lungs: clear bilaterally without retractions or wheezes Cardiovascular: RRR, no murmur, no gallop;  Abdomen: soft, nontender, nondistended, positive bowel sounds Extremities: Left hip externally rotated with tenderness Neuro: alert and oriented, moves all extremities Derm: no significant rashes or nodules; good skin turgor Lymph: no cervical or supraclavicular lymphadenopathy   Labs and imaging studies were reviewed*  Data Reviewed: Basic Metabolic Panel:  Recent Labs Lab 12/17/14 1614 12/18/14 0708  NA 137 137  K 4.3 3.9  CL 104 105  CO2 26 24  GLUCOSE 127* 111*  BUN 20 19  CREATININE 1.16 1.04  CALCIUM 8.8* 8.5*   Liver Function Tests:  Recent Labs Lab 12/17/14 2319  ALBUMIN 3.7   No results for input(s): LIPASE, AMYLASE in the last 168 hours. No results for input(s): AMMONIA in the last 168 hours. CBC:  Recent Labs Lab 12/17/14 1614 12/17/14 2319 12/18/14 0708  WBC 7.4 13.1* 9.0  NEUTROABS 6.1 11.3*  --   HGB 12.3* 12.1* 11.2*  HCT 37.0* 37.2* 33.5*  MCV 96.8 96.7 96.0  PLT 202 182 161   Cardiac Enzymes:   No results for input(s):  CKTOTAL, CKMB, CKMBINDEX, TROPONINI in the last 168 hours. BNP (last 3 results) No results for input(s): BNP in the last 8760 hours.  ProBNP (last 3 results) No results for input(s): PROBNP in the last 8760 hours.  CBG:  Recent Labs Lab 12/18/14 0728  GLUCAP 112*    Recent Results (from the past 240 hour(s))  Surgical pcr screen     Status: None   Collection Time: 12/18/14  8:36 AM  Result Value Ref Range Status   MRSA, PCR NEGATIVE NEGATIVE Final   Staphylococcus aureus NEGATIVE NEGATIVE Final    Comment:        The Xpert SA Assay (FDA approved for NASAL specimens in patients over 73 years of age), is one component of a comprehensive surveillance program.  Test performance has been validated by [facility] for patients greater than or equal to 108 year old. It is not intended to diagnose infection nor to guide or monitor treatment.      Studies: Dg Chest 1 View  12/17/2014   CLINICAL DATA:  Fall today with LEFT hip pain.  LEFT hip fracture.  EXAM: CHEST  1 VIEW  COMPARISON:  Radiograph 07/23/2013  FINDINGS: Normal cardiac silhouette with ectatic aorta. No effusion, infiltrate, or pneumothorax.  IMPRESSION: No acute cardiopulmonary process.   Electronically Signed   By: Suzy Bouchard M.D.   On: 12/17/2014 17:06   Ct Head Wo Contrast  12/17/2014   CLINICAL  DATA:  [place] and hit head today.  Headache.  EXAM: CT HEAD WITHOUT CONTRAST  TECHNIQUE: Contiguous axial images were obtained from the base of the skull through the vertex without intravenous contrast.  COMPARISON:  CT head 07/23/2013  FINDINGS: Mild to moderate generalized atrophy. Mild chronic microvascular ischemic change in the white matter  Negative for acute infarct.  Negative for hemorrhage or mass  Chronic depressed fracture right zygomatic arch is unchanged. No acute skull fracture.  IMPRESSION: No acute intracranial abnormality.   Electronically Signed   By: Franchot Gallo M.D.   On: 12/17/2014 17:25   Dg Hip  Port Unilat With Pelvis 1v Left  12/18/2014   CLINICAL DATA:  Status post open reduction and internal fixation of left hip fracture.  EXAM: DG HIP (WITH OR WITHOUT PELVIS) 1V PORT LEFT  COMPARISON:  December 17, 2014.  FINDINGS: Status post surgical internal fixation of intertrochanteric fracture of proximal left femur. Good alignment of fracture components is noted. No dislocation is noted.  IMPRESSION: Status post surgical internal fixation of proximal left femoral fracture.   Electronically Signed   By: Marijo Conception, M.D.   On: 12/18/2014 14:06   Dg Hip Operative Unilat With Pelvis Left  12/18/2014   CLINICAL DATA:  Fixation of a left intertrochanteric fracture. Intraoperative fluoroscopic spot films. Initial encounter.  EXAM: OPERATIVE LEFT HIP (WITH PELVIS IF PERFORMED) 2 VIEWS  TECHNIQUE: Fluoroscopic spot image(s) were submitted for interpretation post-operatively.  COMPARISON:  Plain films left hip 12/17/2014.  FINDINGS: Left hip screw and short intramedullary nail with a single distal interlocking screw is identified for fixation of an intertrochanteric fracture. Hardware is intact. Position and alignment are anatomic. No acute abnormality is identified.  IMPRESSION: ORIF left intertrochanteric fracture without evidence of complication.   Electronically Signed   By: Inge Rise M.D.   On: 12/18/2014 11:41   Dg Hip Unilat With Pelvis 2-3 Views Left  12/17/2014   CLINICAL DATA:  LEFT hip pain post fall  EXAM: DG HIP (WITH OR WITHOUT PELVIS) 2-3V LEFT  COMPARISON:  None  FINDINGS: Osseous demineralization.  Hip and SI joint spaces preserved and symmetric.  Minimally distracted intertrochanteric fracture LEFT femur.  No additional fracture, dislocation or bone destruction identified.  Degenerative disc disease changes at visualized lower lumbar spine.  IMPRESSION: Intertrochanteric fracture LEFT femur.   Electronically Signed   By: Lavonia Dana M.D.   On: 12/17/2014 17:06    Scheduled Meds: .   ceFAZolin (ANCEF) IV  1 g Intravenous Q6H  . citalopram  30 mg Oral Daily  . docusate sodium  100 mg Oral BID  . donepezil  10 mg Oral QHS  . [START ON 12/19/2014] enoxaparin (LOVENOX) injection  30 mg Subcutaneous Q12H  . ferrous sulfate  325 mg Oral TID PC  . levothyroxine  50 mcg Oral QAC breakfast  . pravastatin  40 mg Oral QPM  . QUEtiapine  50 mg Oral QHS  . [START ON 12/21/2014] Vitamin D (Ergocalciferol)  50,000 Units Oral Q7 days    Continuous Infusions: . 0.9 % NaCl with KCl 20 mEq / L      Assessment/Plan: 1 Fall with left Intertrochanteric Hip fracture- s/p ORIF Continue pain management. Physical Therapy 2 Post-op Anemia; Continue to monitor 3 Depression: On Citalopram 4 Dementia; On Aricept and Quetiapine Likely will need Rehab    Code Status: Full          12/18/2014, 2:23 PM  LOS: 1 day

## 2014-12-18 NOTE — Progress Notes (Signed)
Subjective:  POST-OP CHECK:  Patient is sitting upright in bed. Patient reports pain as mild.  He is tolerating by mouth diet. Patient has no complaints.  Objective:   VITALS:   Filed Vitals:   12/18/14 1355 12/18/14 1500 12/18/14 1603 12/18/14 1722  BP: 102/51  90/63 113/67  Pulse: 70  73 78  Temp: 97.9 F (36.6 C)  98.9 F (37.2 C) 97.6 F (36.4 C)  TempSrc: Oral  Oral Oral  Resp: 18  18 18   Weight:      SpO2: 95% 97% 97% 96%    PHYSICAL EXAM:  Left lower extremity exam reveals: Neurovascular intact Sensation intact distally Intact pulses distally Dorsiflexion/Plantar flexion intact Incision: dressing C/D/I No cellulitis present Compartment soft  LABS  Results for orders placed or performed during the hospital encounter of 12/17/14 (from the past 24 hour(s))  CBC WITH DIFFERENTIAL     Status: Abnormal   Collection Time: 12/17/14 11:19 PM  Result Value Ref Range   WBC 13.1 (H) 3.8 - 10.6 K/uL   RBC 3.84 (L) 4.40 - 5.90 MIL/uL   Hemoglobin 12.1 (L) 13.0 - 18.0 g/dL   HCT 37.2 (L) 40.0 - 52.0 %   MCV 96.7 80.0 - 100.0 fL   MCH 31.5 26.0 - 34.0 pg   MCHC 32.5 32.0 - 36.0 g/dL   RDW 13.5 11.5 - 14.5 %   Platelets 182 150 - 440 K/uL   Neutrophils Relative % 86 %   Neutro Abs 11.3 (H) 1.4 - 6.5 K/uL   Lymphocytes Relative 4 %   Lymphs Abs 0.5 (L) 1.0 - 3.6 K/uL   Monocytes Relative 10 %   Monocytes Absolute 1.3 (H) 0.2 - 1.0 K/uL   Eosinophils Relative 0 %   Eosinophils Absolute 0.0 0 - 0.7 K/uL   Basophils Relative 0 %   Basophils Absolute 0.0 0 - 0.1 K/uL  Albumin     Status: None   Collection Time: 12/17/14 11:19 PM  Result Value Ref Range   Albumin 3.7 3.5 - 5.0 g/dL  Type and screen     Status: None   Collection Time: 12/17/14 11:23 PM  Result Value Ref Range   ABO/RH(D) AB POS    Antibody Screen NEG    Sample Expiration 37/62/8315   Basic metabolic panel     Status: Abnormal   Collection Time: 12/18/14  7:08 AM  Result Value Ref Range   Sodium  137 135 - 145 mmol/L   Potassium 3.9 3.5 - 5.1 mmol/L   Chloride 105 101 - 111 mmol/L   CO2 24 22 - 32 mmol/L   Glucose, Bld 111 (H) 65 - 99 mg/dL   BUN 19 6 - 20 mg/dL   Creatinine, Ser 1.04 0.61 - 1.24 mg/dL   Calcium 8.5 (L) 8.9 - 10.3 mg/dL   GFR calc non Af Amer >60 >60 mL/min   GFR calc Af Amer >60 >60 mL/min   Anion gap 8 5 - 15  CBC     Status: Abnormal   Collection Time: 12/18/14  7:08 AM  Result Value Ref Range   WBC 9.0 3.8 - 10.6 K/uL   RBC 3.49 (L) 4.40 - 5.90 MIL/uL   Hemoglobin 11.2 (L) 13.0 - 18.0 g/dL   HCT 33.5 (L) 40.0 - 52.0 %   MCV 96.0 80.0 - 100.0 fL   MCH 32.2 26.0 - 34.0 pg   MCHC 33.5 32.0 - 36.0 g/dL   RDW 13.5 11.5 - 14.5 %  Platelets 161 150 - 440 K/uL  Protime-INR     Status: Abnormal   Collection Time: 12/18/14  7:08 AM  Result Value Ref Range   Prothrombin Time 16.1 (H) 11.4 - 15.0 seconds   INR 1.27   Glucose, capillary     Status: Abnormal   Collection Time: 12/18/14  7:28 AM  Result Value Ref Range   Glucose-Capillary 112 (H) 65 - 99 mg/dL   Comment 1 Notify RN   Surgical pcr screen     Status: None   Collection Time: 12/18/14  8:36 AM  Result Value Ref Range   MRSA, PCR NEGATIVE NEGATIVE   Staphylococcus aureus NEGATIVE NEGATIVE    Dg Chest 1 View  12/17/2014   CLINICAL DATA:  Fall today with LEFT hip pain.  LEFT hip fracture.  EXAM: CHEST  1 VIEW  COMPARISON:  Radiograph 07/23/2013  FINDINGS: Normal cardiac silhouette with ectatic aorta. No effusion, infiltrate, or pneumothorax.  IMPRESSION: No acute cardiopulmonary process.   Electronically Signed   By: Suzy Bouchard M.D.   On: 12/17/2014 17:06   Ct Head Wo Contrast  12/17/2014   CLINICAL DATA:  [place] and hit head today.  Headache.  EXAM: CT HEAD WITHOUT CONTRAST  TECHNIQUE: Contiguous axial images were obtained from the base of the skull through the vertex without intravenous contrast.  COMPARISON:  CT head 07/23/2013  FINDINGS: Mild to moderate generalized atrophy. Mild chronic  microvascular ischemic change in the white matter  Negative for acute infarct.  Negative for hemorrhage or mass  Chronic depressed fracture right zygomatic arch is unchanged. No acute skull fracture.  IMPRESSION: No acute intracranial abnormality.   Electronically Signed   By: Franchot Gallo M.D.   On: 12/17/2014 17:25   Dg Hip Port Unilat With Pelvis 1v Left  12/18/2014   CLINICAL DATA:  Status post open reduction and internal fixation of left hip fracture.  EXAM: DG HIP (WITH OR WITHOUT PELVIS) 1V PORT LEFT  COMPARISON:  December 17, 2014.  FINDINGS: Status post surgical internal fixation of intertrochanteric fracture of proximal left femur. Good alignment of fracture components is noted. No dislocation is noted.  IMPRESSION: Status post surgical internal fixation of proximal left femoral fracture.   Electronically Signed   By: Marijo Conception, M.D.   On: 12/18/2014 14:06   Dg Hip Operative Unilat With Pelvis Left  12/18/2014   CLINICAL DATA:  Fixation of a left intertrochanteric fracture. Intraoperative fluoroscopic spot films. Initial encounter.  EXAM: OPERATIVE LEFT HIP (WITH PELVIS IF PERFORMED) 2 VIEWS  TECHNIQUE: Fluoroscopic spot image(s) were submitted for interpretation post-operatively.  COMPARISON:  Plain films left hip 12/17/2014.  FINDINGS: Left hip screw and short intramedullary nail with a single distal interlocking screw is identified for fixation of an intertrochanteric fracture. Hardware is intact. Position and alignment are anatomic. No acute abnormality is identified.  IMPRESSION: ORIF left intertrochanteric fracture without evidence of complication.   Electronically Signed   By: Inge Rise M.D.   On: 12/18/2014 11:41   Dg Hip Unilat With Pelvis 2-3 Views Left  12/17/2014   CLINICAL DATA:  LEFT hip pain post fall  EXAM: DG HIP (WITH OR WITHOUT PELVIS) 2-3V LEFT  COMPARISON:  None  FINDINGS: Osseous demineralization.  Hip and SI joint spaces preserved and symmetric.  Minimally  distracted intertrochanteric fracture LEFT femur.  No additional fracture, dislocation or bone destruction identified.  Degenerative disc disease changes at visualized lower lumbar spine.  IMPRESSION: Intertrochanteric fracture LEFT femur.  Electronically Signed   By: Lavonia Dana M.D.   On: 12/17/2014 17:06    Assessment/Plan: Day of Surgery   Active Problems:   Hip fracture  Patient doing well postop. He will complete 24 hours postop antibiotics. Continue current pain management. He'll begin physical and occupational therapy tomorrow morning. Recheck labs in the a.m. Encourage incentive spirometry each hour while awake. I reviewed postop x-rays demonstrating the intramedullary construct is well positioned and the fractures well reduced.    Thornton Park , MD 12/18/2014, 5:50 PM

## 2014-12-18 NOTE — Anesthesia Preprocedure Evaluation (Addendum)
Anesthesia Evaluation  Patient identified by MRN, date of birth, ID band Patient awake    Reviewed: Allergy & Precautions, NPO status , Patient's Chart, lab work & pertinent test results, reviewed documented beta blocker date and time   Airway Mallampati: II  TM Distance: >3 FB     Dental  (+) Chipped   Pulmonary COPDformer smoker,          Cardiovascular     Neuro/Psych PSYCHIATRIC DISORDERS Anxiety Depression    GI/Hepatic   Endo/Other    Renal/GU      Musculoskeletal   Abdominal   Peds  Hematology   Anesthesia Other Findings   Reproductive/Obstetrics                            Anesthesia Physical Anesthesia Plan  ASA: III  Anesthesia Plan: Spinal   Post-op Pain Management:    Induction:   Airway Management Planned:   Additional Equipment:   Intra-op Plan:   Post-operative Plan:   Informed Consent: I have reviewed the patients History and Physical, chart, labs and discussed the procedure including the risks, benefits and alternatives for the proposed anesthesia with the patient or authorized representative who has indicated his/her understanding and acceptance.     Plan Discussed with: CRNA  Anesthesia Plan Comments:         Anesthesia Quick Evaluation

## 2014-12-18 NOTE — Op Note (Signed)
DATE OF SURGERY:  12/18/2014  TIME: 12:22 PM  PATIENT NAME:  Barry Coffey  AGE: 81 y.o.  PRE-OPERATIVE DIAGNOSIS:  LEFT INTERTROCHANTERIC HIP FRACTURE  POST-OPERATIVE DIAGNOSIS:  SAME  PROCEDURE:  LEFT INTRAMEDULLARY (IM) FIXATION FOR INTERTROCHANTERIC HIP FRACTURE  SURGEON:  Thornton Park  OPERATIVE IMPLANTS: Biomet short Affixus nail 11 x 141mm, 105 mm lag screw with a 44 mm distal interlocking screw  PREOPERATIVE INDICATIONS:  [name] is a 81 y.o. year old who fell and suffered a hip fracture. He was brought into the ER and then admitted and medically cleared for surgical intervention.    The risks, benefits and alternatives were discussed with the daughter in law.  The risks include but are not limited to infection, bleeding, nerve or blood vessel injury, malunion, nonunion, hardware prominence, hardware failure, change in leg lengths or lower extremity rotation need for further surgery including hardware removal with conversion to a total hip arthroplasty. Medical risks include but are not limited to DVT and pulmonary embolism, myocardial infarction, stroke, pneumonia, respiratory failure and death. The patient and his daughter in law understood these risks and wished to proceed with surgery.  OPERATIVE PROCEDURE:  The patient was brought to the operating room and placed in the supine position on the fracture table.. General anesthesia was administered.  A closed reduction was performed under C-arm guidance.  The fracture reduction was confirmed on both AP and lateral views. A time out was performed to verify the patient's name, date of birth, medical record number, correct site of surgery correct procedure to be performed. The timeout was also used to verify the patient received antibiotics and all appropriate instruments, implants and radiographic studies were available in the room. Once all in attendance were in agreement, the case began. The patient was prepped and  draped in a sterile fashion. He received preoperative antibiotics.  An incision was made proximal to the greater trochanter in line with the femur. A guidewire was placed over the tip of the greater trochanter and advanced into the proximal femur to the level of the lesser trochanter.  Confirmation of the drill pin position was made on AP and lateral C-arm images.  The threaded guidepin was then overdrilled with the proximal femoral drill.  The nail was then inserted into the proximal femur, across the fracture site and into the femoral shaft. Its position was confirmed on AP and lateral C-arm images.   Once the nail was completely seated, the drill guide for the lag screw was placed through the guide arm for the Affixus nail. A guidepin was then placed through this drill guide and advanced through the lateral cortex of the femur, across the fracture site and into the femoral head achieving a tip apex distance of less than 25 mm. The length of the drill pin was measured, and then the drill for the lag screw was advanced through the lateral cortex, across the fracture site and up into the femoral head to the depth of the lag screw..  The lag screw was then advanced by hand into position across the fracture site into the femoral head. Its final position was confirmed on AP and lateral C-arm images. Compression was applied as traction was carefully released. The set screw in the top of the intramedullary rod was tightened by hand using a screwdriver. It was backed off a quarter turn to allow for compression at the fracture site.  The drill sleeve for the distal interlocking screw was then placed through  the Affixus guide arm. A small stab incision was made to allow the drill guide to approximate the lateral cortex of the femur. The drill for the distal interlocking screw was then advanced bicortically. The depth of this drill was measured. A distal interlocking screw with the length measured was then inserted by  hand through the guide arm. Final C-arm images of the entire intramedullary construct were taken in both the AP and lateral planes.   The wounds were irrigated copiously and closed with 0 Vicryl for closure of the deep fashion and 2-0 Vicryl for his subcutaneous closure. The skin was approximated with staples. A dry sterile dressing was applied to his left hip.  He was also noted to have a skin tear over the lateral aspect of his left elbow approximately 3x3 cm.  I applied a sterile dressing including Xeroform, sterile 4 x 4's and Kerlix over this elbow wound is well.   I was scrubbed and present the entire case and all sharp and instrument counts were correct at the conclusion of the case. Patient was transferred to hospital bed and brought to PACU in stable condition. I spoke with the patient's daughter-in-law by phone at the conclusion of the case to let them know the case had gone without complication and the patient was stable in the recovery room.  He will be partial weightbearing and begin physical and occupational therapy tomorrow. The patient will started on medical DVT prophylaxis tomorrow.    Timoteo Gaul, MD

## 2014-12-18 NOTE — Anesthesia Procedure Notes (Signed)
Spinal Patient location during procedure: OR Staffing Anesthesiologist: Gunnar Bulla Performed by: anesthesiologist  Preanesthetic Checklist Completed: patient identified, site marked, surgical consent, pre-op evaluation, timeout performed, IV checked and risks and benefits discussed Spinal Block Patient position: sitting Prep: Betadine Patient monitoring: heart rate, cardiac monitor, continuous pulse ox and blood pressure Approach: midline Location: L3-4 Injection technique: single-shot Needle Needle type: Pencil-Tip  Needle gauge: 25 G Needle length: 9 cm Assessment Sensory level: T10 Additional Notes 1025 - intrathecal marcaine 12.5mg .

## 2014-12-18 NOTE — Care Management (Signed)
Patient was not in room when this RNCM rounded. List of home health care agencies left at bedside with this RNCM's contact. RNCM will continue to follow.

## 2014-12-18 NOTE — Transfer of Care (Signed)
Immediate Anesthesia Transfer of Care Note  Patient: Barry Coffey  Procedure(s) Performed: Procedure(s): INTRAMEDULLARY (IM) NAIL FEMORAL (Left)  Patient Location: PACU  Anesthesia Type:Spinal  Level of Consciousness: awake, alert  and oriented  Airway & Oxygen Therapy: Patient Spontanous Breathing and Patient connected to face mask oxygen  Post-op Assessment: Report given to RN and Post -op Vital signs reviewed and stable  Post vital signs: Reviewed and stable  Last Vitals:  Filed Vitals:   12/18/14 0809  BP: 102/31  Pulse: 63  Temp: 37 C  Resp: 18  BP: 111/50 Pulse: 75 Temp 74.9 Resp 16  Complications: No apparent anesthesia complications

## 2014-12-19 LAB — CBC WITH DIFFERENTIAL/PLATELET
BASOS ABS: 0 10*3/uL (ref 0–0.1)
BASOS PCT: 0 %
EOS ABS: 0 10*3/uL (ref 0–0.7)
Eosinophils Relative: 0 %
HEMATOCRIT: 30 % — AB (ref 40.0–52.0)
HEMOGLOBIN: 9.9 g/dL — AB (ref 13.0–18.0)
Lymphocytes Relative: 5 %
Lymphs Abs: 0.6 10*3/uL — ABNORMAL LOW (ref 1.0–3.6)
MCH: 31.9 pg (ref 26.0–34.0)
MCHC: 33.1 g/dL (ref 32.0–36.0)
MCV: 96.3 fL (ref 80.0–100.0)
Monocytes Absolute: 1.4 10*3/uL — ABNORMAL HIGH (ref 0.2–1.0)
Monocytes Relative: 12 %
NEUTROS ABS: 9.5 10*3/uL — AB (ref 1.4–6.5)
NEUTROS PCT: 83 %
Platelets: 132 10*3/uL — ABNORMAL LOW (ref 150–440)
RBC: 3.11 MIL/uL — AB (ref 4.40–5.90)
RDW: 13 % (ref 11.5–14.5)
WBC: 11.6 10*3/uL — AB (ref 3.8–10.6)

## 2014-12-19 LAB — BASIC METABOLIC PANEL
ANION GAP: 7 (ref 5–15)
BUN: 14 mg/dL (ref 6–20)
CALCIUM: 8.4 mg/dL — AB (ref 8.9–10.3)
CO2: 26 mmol/L (ref 22–32)
CREATININE: 1.15 mg/dL (ref 0.61–1.24)
Chloride: 103 mmol/L (ref 101–111)
GFR, EST NON AFRICAN AMERICAN: 55 mL/min — AB (ref >60)
Glucose, Bld: 131 mg/dL — ABNORMAL HIGH (ref 65–99)
Potassium: 4 mmol/L (ref 3.5–5.1)
SODIUM: 136 mmol/L (ref 135–145)

## 2014-12-19 LAB — VITAMIN D 25 HYDROXY (VIT D DEFICIENCY, FRACTURES): Vit D, 25-Hydroxy: 32.9 ng/mL (ref 30.0–100.0)

## 2014-12-19 NOTE — Evaluation (Signed)
Physical Therapy Evaluation Patient Details Name: Barry Coffey MRN: [id number] DOB: 1925-10-24 Today's Date: 12/19/2014   History of Present Illness  Pt with fall, hip fx, ORIF  Clinical Impression  Pt has general confusion with very little agitation, though he is not able to give much history he does make a good effort with PT despite his L LE weakness/pain during the 10 minutes of therex apart from the PT exam.  He does very poorly with ambulation and struggles to maintain balance, appropriate use of the walker and generally trying to move LEs.    Follow Up Recommendations SNF    Equipment Recommendations       Recommendations for Other Services       Precautions / Restrictions Precautions Precautions: Fall Restrictions Other Position/Activity Restrictions: WBAT      Mobility  Bed Mobility Overal bed mobility: Needs Assistance Bed Mobility: Sit to Supine       Sit to supine: Mod assist;Max assist   General bed mobility comments: pt doesn't follow instructions to get back to supine until PT actively assists  Transfers Overall transfer level: Needs assistance Equipment used: Rolling walker (2 wheeled) Transfers: Sit to/from Stand Sit to Stand: Max assist;Mod assist         General transfer comment: Pt leaning backwards and stuggles to rise, once up he is anxious and confused about what to do  Ambulation/Gait Ambulation/Gait assistance: Max assist Ambulation Distance (Feet): 2 Feet Assistive device: Rolling walker (2 wheeled)       General Gait Details: Pt struggles with recliner to bed "ambulation" needing heavy assist to turn hips and to insure safety.  He is confused and hesitant to use L LE for good WBing with walker.  Stairs            Wheelchair Mobility    Modified Rankin (Stroke Patients Only)       Balance                                             Pertinent Vitals/Pain Pain Assessment:  (unable to rate,  reports moderate pain in L hip)    Home Living Family/patient expects to be discharged to:: Private residence Living Arrangements:  (confused, unable to answer most questions appropriatley)                    Prior Function Level of Independence:  (difficult to assess secondary to mental status)               Hand Dominance        Extremity/Trunk Assessment   Upper Extremity Assessment: Generalized weakness           Lower Extremity Assessment: Generalized weakness;LLE deficits/detail   LLE Deficits / Details: Pt has expected limitations with L hip AROM, but is able to show effort with each requested activity     Communication   Communication: No difficulties  Cognition Arousal/Alertness: Lethargic   Overall Cognitive Status: History of cognitive impairments - at baseline                      General Comments      Exercises General Exercises - Lower Extremity Ankle Circles/Pumps: AROM;10 reps Quad Sets: Strengthening;10 reps Heel Slides: 10 reps;AAROM;AROM Hip ABduction/ADduction: AAROM;10 reps      Assessment/Plan    PT Assessment Patient  needs continued PT services  PT Diagnosis Difficulty walking;Generalized weakness;Acute pain   PT Problem List Decreased strength;Decreased range of motion;Decreased balance;Decreased activity tolerance;Decreased safety awareness  PT Treatment Interventions Gait training;DME instruction;Functional mobility training;Therapeutic exercise;Therapeutic activities;Balance training;Neuromuscular re-education   PT Goals (Current goals can be found in the Care Plan section) Acute Rehab PT Goals Patient Stated Goal: none stated PT Goal Formulation: With patient Time For Goal Achievement: 01/02/15 Potential to Achieve Goals: Fair    Frequency BID   Barriers to discharge        Co-evaluation               End of Session Equipment Utilized During Treatment: Gait belt Activity Tolerance: Patient  tolerated treatment well (mental status) Patient left: with bed alarm set Nurse Communication: Mobility status         Time: 8786-7672 PT Time Calculation (min) (ACUTE ONLY): 35 min   Charges:   PT Evaluation $Initial PT Evaluation Tier I: 1 Procedure PT Treatments $Therapeutic Exercise: 8-22 mins   PT G Codes:       [name], PT, DPT [phone]  [name] 12/19/2014, 1:23 PM

## 2014-12-19 NOTE — Care Management (Signed)
RNCM consult received and will continue to follow. PT is recommending SNF at this time.

## 2014-12-19 NOTE — Progress Notes (Signed)
PROGRESS NOTE  Barry Coffey QJJ:[id number] DOB: [birth year] DOA: 12/17/2014 PCP: Tracie Harrier, MD  Subjective 81 y/o male with hx of Depression, Dementia, hyperlipidemia, COPD admitted with  Left Hip inter trochantric fracture after a fall- s/p ORIF. Doing well this am. Denies pain Consultants:  Ortho;Dr.Krasinski   Objective: BP 104/59 mmHg  Pulse 85  Temp(Src) 98.4 F (36.9 C) (Oral)  Resp 18  Wt 75.025 kg (165 lb 6.4 oz)  SpO2 91%  Intake/Output Summary (Last 24 hours) at 12/19/14 0823 Last data filed at 12/19/14 0655  Gross per 24 hour  Intake   1380 ml  Output   1350 ml  Net     30 ml   Filed Weights   12/17/14 2044  Weight: 75.025 kg (165 lb 6.4 oz)    Exam:  General: Not in distress HEENT: PERRL; OP moist without lesions. Neck: supple, trachea midline, no thyromegaly Chest: normal to palpation Lungs: clear bilaterally without retractions or wheezes Cardiovascular: RRR, no murmur, no gallop;  Abdomen: soft, nontender, nondistended, positive bowel sounds Extremities: Left hip  Mild ecchymosis . Incision looks ok Neuro: alert and oriented, moves all extremities Derm: no significant rashes or nodules; good skin turgor Lymph: no cervical or supraclavicular lymphadenopathy   Labs and imaging studies were reviewed*  Data Reviewed: Basic Metabolic Panel:  Recent Labs Lab 12/17/14 1614 12/18/14 0708 12/19/14 0716  NA 137 137 136  K 4.3 3.9 4.0  CL 104 105 103  CO2 26 24 26   GLUCOSE 127* 111* 131*  BUN 20 19 14   CREATININE 1.16 1.04 1.15  CALCIUM 8.8* 8.5* 8.4*   Liver Function Tests:  Recent Labs Lab 12/17/14 2319  ALBUMIN 3.7   No results for input(s): LIPASE, AMYLASE in the last 168 hours. No results for input(s): AMMONIA in the last 168 hours. CBC:  Recent Labs Lab 12/17/14 1614 12/17/14 2319 12/18/14 0708 12/19/14 0716  WBC 7.4 13.1* 9.0 11.6*  NEUTROABS 6.1 11.3*  --  9.5*  HGB 12.3* 12.1* 11.2* 9.9*  HCT 37.0* 37.2*  33.5* 30.0*  MCV 96.8 96.7 96.0 96.3  PLT 202 182 161 132*   Cardiac Enzymes:   No results for input(s): CKTOTAL, CKMB, CKMBINDEX, TROPONINI in the last 168 hours. BNP (last 3 results) No results for input(s): BNP in the last 8760 hours.  ProBNP (last 3 results) No results for input(s): PROBNP in the last 8760 hours.  CBG:  Recent Labs Lab 12/18/14 0728  GLUCAP 112*    Recent Results (from the past 240 hour(s))  Surgical pcr screen     Status: None   Collection Time: 12/18/14  8:36 AM  Result Value Ref Range Status   MRSA, PCR NEGATIVE NEGATIVE Final   Staphylococcus aureus NEGATIVE NEGATIVE Final    Comment:        The Xpert SA Assay (FDA approved for NASAL specimens in patients over 27 years of age), is one component of a comprehensive surveillance program.  Test performance has been validated by [facility] for patients greater than or equal to 77 year old. It is not intended to diagnose infection nor to guide or monitor treatment.      Studies: Dg Hip Port Unilat With Pelvis 1v Left  12/18/2014   CLINICAL DATA:  Status post open reduction and internal fixation of left hip fracture.  EXAM: DG HIP (WITH OR WITHOUT PELVIS) 1V PORT LEFT  COMPARISON:  December 17, 2014.  FINDINGS: Status post surgical internal fixation of intertrochanteric fracture of  proximal left femur. Good alignment of fracture components is noted. No dislocation is noted.  IMPRESSION: Status post surgical internal fixation of proximal left femoral fracture.   Electronically Signed   By: Marijo Conception, M.D.   On: 12/18/2014 14:06   Dg Hip Operative Unilat With Pelvis Left  12/18/2014   CLINICAL DATA:  Fixation of a left intertrochanteric fracture. Intraoperative fluoroscopic spot films. Initial encounter.  EXAM: OPERATIVE LEFT HIP (WITH PELVIS IF PERFORMED) 2 VIEWS  TECHNIQUE: Fluoroscopic spot image(s) were submitted for interpretation post-operatively.  COMPARISON:  Plain films left hip 12/17/2014.   FINDINGS: Left hip screw and short intramedullary nail with a single distal interlocking screw is identified for fixation of an intertrochanteric fracture. Hardware is intact. Position and alignment are anatomic. No acute abnormality is identified.  IMPRESSION: ORIF left intertrochanteric fracture without evidence of complication.   Electronically Signed   By: Inge Rise M.D.   On: 12/18/2014 11:41    Scheduled Meds: . citalopram  30 mg Oral Daily  . docusate sodium  100 mg Oral BID  . donepezil  10 mg Oral QHS  . enoxaparin (LOVENOX) injection  30 mg Subcutaneous Q12H  . ferrous sulfate  325 mg Oral TID PC  . levothyroxine  50 mcg Oral QAC breakfast  . pravastatin  40 mg Oral QPM  . QUEtiapine  50 mg Oral QHS  . [START ON 12/21/2014] Vitamin D (Ergocalciferol)  50,000 Units Oral Q7 days    Continuous Infusions: . 0.9 % NaCl with KCl 20 mEq / L 50 mL/hr at 12/18/14 1507    Assessment/Plan: 1 Fall with left Intertrochanteric Hip fracture- s/p ORIF Continue pain management. Physical Therapy 2 Post-op Anemia;  Hgb; 9.9-Continue to monitor 3 Depression: On Citalopram 4 Dementia; On Aricept and Quetiapine 5 For Rehab soon   Code Status: Full          12/19/2014, 8:23 AM  LOS: 2 days

## 2014-12-19 NOTE — Clinical Social Work Placement (Signed)
   CLINICAL SOCIAL WORK PLACEMENT  NOTE  Date:  12/19/2014  Patient Details  Name: Barry Coffey MRN: [id number] Date of Birth: 27-Aug-1925  Clinical Social Work is seeking post-discharge placement for this patient at the [place] level of care (*CSW will initial, date and re-position this form in  chart as items are completed):  Yes   Patient/family provided with [place] Work Department's list of facilities offering this level of care within the geographic area requested by the patient (or if unable, by the patient's family).  Yes   Patient/family informed of their freedom to choose among providers that offer the needed level of care, that participate in Medicare, Medicaid or managed care program needed by the patient, have an available bed and are willing to accept the patient.  Yes   Patient/family informed of [facility]'s ownership interest in [facility] and [facility], as well as of the fact that they are under no obligation to receive care at these facilities.  PASRR submitted to EDS on 12/19/14     PASRR number received on 12/19/14     Existing PASRR number confirmed on       FL2 transmitted to all facilities in geographic area requested by pt/family on 12/19/14     FL2 transmitted to all facilities within larger geographic area on       Patient informed that his/her managed care company has contracts with or will negotiate with certain facilities, including the following:        Yes   Patient/family informed of bed offers received.  Patient chooses bed at  [facility] )     Physician recommends and patient chooses bed at      Patient to be transferred to   on  .  Patient to be transferred to facility by       Patient family notified on   of transfer.  Name of family member notified:        PHYSICIAN Please sign FL2     Additional Comment:    _______________________________________________ Loralyn Freshwater,  LCSW 12/19/2014, 5:25 PM

## 2014-12-19 NOTE — Anesthesia Postprocedure Evaluation (Signed)
  Anesthesia Post-op Note  Patient: Barry Coffey  Procedure(s) Performed: Procedure(s): INTRAMEDULLARY (IM) NAIL FEMORAL (Left)  Anesthesia type:Spinal  Patient location: PACU  Post pain: Pain level controlled  Post assessment: Post-op Vital signs reviewed, Patient's Cardiovascular Status Stable, Respiratory Function Stable, Patent Airway and No signs of Nausea or vomiting  Post vital signs: Reviewed and stable  Last Vitals:  Filed Vitals:   12/18/14 2114  BP: 101/58  Pulse: 77  Temp: 37.2 C  Resp: 18    Level of consciousness: awake, alert  and patient cooperative  Complications: No apparent anesthesia complications

## 2014-12-19 NOTE — Progress Notes (Signed)
Subjective:  Postoperative day #1 status post left intertrochanteric hip fracture treated with intramedullary fixation. Patient reports pain as mild.  Patient is sleeping comfortably. He is arousable and follows commands. There are no acute issues.  Objective:   VITALS:   Filed Vitals:   12/18/14 1603 12/18/14 1722 12/18/14 2114 12/19/14 0805  BP: 90/63 113/67 101/58 104/59  Pulse: 73 78 77 85  Temp: 98.9 F (37.2 C) 97.6 F (36.4 C) 99 F (37.2 C) 98.4 F (36.9 C)  TempSrc: Oral Oral Oral Oral  Resp: 18 18 18 18   Weight:      SpO2: 97% 96% 100% 91%    PHYSICAL EXAM:  Left lower extremity exam reveals: Neurovascular intact Sensation intact distally Intact pulses distally Dorsiflexion/Plantar flexion intact Incision: dressing C/D/I No cellulitis present Compartment soft  LABS  Results for orders placed or performed during the hospital encounter of 12/17/14 (from the past 24 hour(s))  Basic metabolic panel     Status: Abnormal   Collection Time: 12/19/14  7:16 AM  Result Value Ref Range   Sodium 136 135 - 145 mmol/L   Potassium 4.0 3.5 - 5.1 mmol/L   Chloride 103 101 - 111 mmol/L   CO2 26 22 - 32 mmol/L   Glucose, Bld 131 (H) 65 - 99 mg/dL   BUN 14 6 - 20 mg/dL   Creatinine, Ser 1.15 0.61 - 1.24 mg/dL   Calcium 8.4 (L) 8.9 - 10.3 mg/dL   GFR calc non Af Amer 55 (L) >60 mL/min   GFR calc Af Amer >60 >60 mL/min   Anion gap 7 5 - 15  CBC with Differential/Platelet     Status: Abnormal   Collection Time: 12/19/14  7:16 AM  Result Value Ref Range   WBC 11.6 (H) 3.8 - 10.6 K/uL   RBC 3.11 (L) 4.40 - 5.90 MIL/uL   Hemoglobin 9.9 (L) 13.0 - 18.0 g/dL   HCT 30.0 (L) 40.0 - 52.0 %   MCV 96.3 80.0 - 100.0 fL   MCH 31.9 26.0 - 34.0 pg   MCHC 33.1 32.0 - 36.0 g/dL   RDW 13.0 11.5 - 14.5 %   Platelets 132 (L) 150 - 440 K/uL   Neutrophils Relative % 83 %   Neutro Abs 9.5 (H) 1.4 - 6.5 K/uL   Lymphocytes Relative 5 %   Lymphs Abs 0.6 (L) 1.0 - 3.6 K/uL   Monocytes  Relative 12 %   Monocytes Absolute 1.4 (H) 0.2 - 1.0 K/uL   Eosinophils Relative 0 %   Eosinophils Absolute 0.0 0 - 0.7 K/uL   Basophils Relative 0 %   Basophils Absolute 0.0 0 - 0.1 K/uL    Dg Chest 1 View  12/17/2014   CLINICAL DATA:  Fall today with LEFT hip pain.  LEFT hip fracture.  EXAM: CHEST  1 VIEW  COMPARISON:  Radiograph 07/23/2013  FINDINGS: Normal cardiac silhouette with ectatic aorta. No effusion, infiltrate, or pneumothorax.  IMPRESSION: No acute cardiopulmonary process.   Electronically Signed   By: Suzy Bouchard M.D.   On: 12/17/2014 17:06   Ct Head Wo Contrast  12/17/2014   CLINICAL DATA:  [place] and hit head today.  Headache.  EXAM: CT HEAD WITHOUT CONTRAST  TECHNIQUE: Contiguous axial images were obtained from the base of the skull through the vertex without intravenous contrast.  COMPARISON:  CT head 07/23/2013  FINDINGS: Mild to moderate generalized atrophy. Mild chronic microvascular ischemic change in the white matter  Negative for acute infarct.  Negative for hemorrhage or mass  Chronic depressed fracture right zygomatic arch is unchanged. No acute skull fracture.  IMPRESSION: No acute intracranial abnormality.   Electronically Signed   By: Franchot Gallo M.D.   On: 12/17/2014 17:25   Dg Hip Port Unilat With Pelvis 1v Left  12/18/2014   CLINICAL DATA:  Status post open reduction and internal fixation of left hip fracture.  EXAM: DG HIP (WITH OR WITHOUT PELVIS) 1V PORT LEFT  COMPARISON:  December 17, 2014.  FINDINGS: Status post surgical internal fixation of intertrochanteric fracture of proximal left femur. Good alignment of fracture components is noted. No dislocation is noted.  IMPRESSION: Status post surgical internal fixation of proximal left femoral fracture.   Electronically Signed   By: Marijo Conception, M.D.   On: 12/18/2014 14:06   Dg Hip Operative Unilat With Pelvis Left  12/18/2014   CLINICAL DATA:  Fixation of a left intertrochanteric fracture. Intraoperative  fluoroscopic spot films. Initial encounter.  EXAM: OPERATIVE LEFT HIP (WITH PELVIS IF PERFORMED) 2 VIEWS  TECHNIQUE: Fluoroscopic spot image(s) were submitted for interpretation post-operatively.  COMPARISON:  Plain films left hip 12/17/2014.  FINDINGS: Left hip screw and short intramedullary nail with a single distal interlocking screw is identified for fixation of an intertrochanteric fracture. Hardware is intact. Position and alignment are anatomic. No acute abnormality is identified.  IMPRESSION: ORIF left intertrochanteric fracture without evidence of complication.   Electronically Signed   By: Inge Rise M.D.   On: 12/18/2014 11:41   Dg Hip Unilat With Pelvis 2-3 Views Left  12/17/2014   CLINICAL DATA:  LEFT hip pain post fall  EXAM: DG HIP (WITH OR WITHOUT PELVIS) 2-3V LEFT  COMPARISON:  None  FINDINGS: Osseous demineralization.  Hip and SI joint spaces preserved and symmetric.  Minimally distracted intertrochanteric fracture LEFT femur.  No additional fracture, dislocation or bone destruction identified.  Degenerative disc disease changes at visualized lower lumbar spine.  IMPRESSION: Intertrochanteric fracture LEFT femur.   Electronically Signed   By: Lavonia Dana M.D.   On: 12/17/2014 17:06    Assessment/Plan: 1 Day Post-Op   Active Problems:   Hip fracture  Patient doing well postop. Continue physical and occupational therapy. Encourage incentive spirometry.    Thornton Park , MD 12/19/2014, 12:29 PM

## 2014-12-19 NOTE — Clinical Social Work Note (Signed)
Clinical Social Work Assessment  Patient Details  Name: Barry Coffey MRN: [id number] Date of Birth: 07/15/1925  Date of referral:  12/19/14               Reason for consult:  Facility Placement                Permission sought to share information with:  Chartered certified accountant granted to share information::  Yes, Verbal Permission Granted  Name::      [place]::   [name]  Relationship::     Contact Information:     Housing/Transportation Living arrangements for the past 2 months:  [place] of Information:  Adult Children Patient Interpreter Needed:  None Criminal Activity/Legal Involvement Pertinent to Current Situation/Hospitalization:  No - Comment as needed Significant Relationships:  Adult Children Lives with:  Adult Children Do you feel safe going back to the place where you live?  Yes Need for family participation in patient care:  Yes (Comment)  Care giving concerns: Patient lives with his son and daughter in law [name] in [place].    Social Worker assessment / plan: [place] representative ([place]) received SNF consult. PT is recommending SNF. Patient is not alert and oriented. CSW contacted patient's daughter in [place] Coffey [facility]) [phone]. 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 [name] is agreeable to SNF search.  CSW presented bed offers to [name]. She chose [facility]. CSW sent D/C summary to [facility] admissions coordinator at [facility] today via carefinder. Per Florentina Jenny patient can admit to [facility] on Sunday.   Employment status:  Disabled (Comment on whether or not currently receiving Disability), Retired Forensic scientist:  Medicare PT Recommendations:  [place] / Referral to community resources:  [place]  Patient/Family's Response to care: [name] is agreeable for patient  to D/C to [facility] on Sunday 12/21/14.   Patient/Family's Understanding of and Emotional Response to Diagnosis, Current Treatment, and Prognosis: [name] thanked CSW for calling and assisting with placement.   Emotional Assessment Appearance:  Appears stated age Attitude/Demeanor/Rapport:  Unable to Assess Affect (typically observed):  Unable to Assess Orientation:  Fluctuating Orientation (Suspected and/or reported Sundowners) Alcohol / Substance use:  Not Applicable Psych involvement (Current and /or in the community):  No (Comment)  Discharge Needs  Concerns to be addressed:  Discharge Planning Concerns Readmission within the last 30 days:  No Current discharge risk:  Cognitively Impaired Barriers to Discharge:  Continued Medical Work up   Loralyn Freshwater, LCSW 12/19/2014, 5:27 PM

## 2014-12-19 NOTE — Care Management Important Message (Signed)
Important Message  Patient Details  Name: Barry Coffey MRN: [id number] Date of Birth: 1925/07/15   Medicare Important Message Given:  Yes-second notification given    Juliann Pulse A Allmond 12/19/2014, 9:50 AM

## 2014-12-19 NOTE — Discharge Summary (Signed)
Physician Discharge Summary  Patient ID: STORMY SABOL MRN: [id number] DOB/AGE: [birth year] 81 y.o.  Admit date: 12/17/2014 Discharge date: 12/19/2014  Admission Diagnoses:  HIP FRACTURE LEFT  Discharge Diagnoses:  HIP FRACTURE LEFT  S/p IM ROD for LEFT HIP FRACTURE Active Problems:   Hip fracture   Past Medical History  Diagnosis Date  . Hyperlipidemia   . Dementia   . Depression   . Anxiety   . Bladder cancer 2011    s/p resection with mitomycin injection;Dr.Stoioff  . COPD (chronic obstructive pulmonary disease)     Surgeries: Procedure(s): INTRAMEDULLARY (IM) NAIL FEMORAL on 12/17/2014 - 12/18/2014   Consultants (if any): Treatment Team:  Thornton Park, MD  Discharged Condition: Improved  Hospital Course: CORRIGAN KRETSCHMER is an 81 y.o. male who was admitted 12/17/2014 with a diagnosis of  HIP FRACTURE LEFT and went to the operating room on 12/18/2014 and underwent the above named procedures.    He was given perioperative antibiotics:  Anti-infectives    Start     Dose/Rate Route Frequency Ordered Stop   12/18/14 1415  ceFAZolin (ANCEF) IVPB 1 g/50 mL premix     1 g 100 mL/hr over 30 Minutes Intravenous Every 6 hours 12/18/14 1412 12/19/14 0239   12/17/14 2327  ceFAZolin (ANCEF) IVPB 2 g/50 mL premix     2 g 100 mL/hr over 30 Minutes Intravenous 30 min pre-op 12/17/14 2327 12/18/14 1035    . Foley was removed on POD #1.  Hemoglobin remained stable post-op.  He started PT/OT on POD#1.  He completed 24 hours of post-op antibiotics.  He was given sequential compression devices, early ambulation, and lovenox for DVT prophylaxis.  He benefited maximally from the hospital stay and there were no complications.    Recent vital signs:  Filed Vitals:   12/19/14 0805  BP: 104/59  Pulse: 85  Temp: 98.4 F (36.9 C)  Resp: 18    Recent laboratory studies:  Lab Results  Component Value Date   HGB 9.9* 12/19/2014   HGB 11.2* 12/18/2014   HGB 12.1* 12/17/2014    Lab Results  Component Value Date   WBC 11.6* 12/19/2014   PLT 132* 12/19/2014   Lab Results  Component Value Date   INR 1.27 12/18/2014   Lab Results  Component Value Date   NA 136 12/19/2014   K 4.0 12/19/2014   CL 103 12/19/2014   CO2 26 12/19/2014   BUN 14 12/19/2014   CREATININE 1.15 12/19/2014   GLUCOSE 131* 12/19/2014    Discharge Medications:     Medication List    ASK your doctor about these medications        citalopram 10 MG tablet  Commonly known as:  CELEXA  Take 30 mg by mouth daily.     donepezil 10 MG tablet  Commonly known as:  ARICEPT  Take 10 mg by mouth at bedtime.     levothyroxine 50 MCG tablet  Commonly known as:  SYNTHROID, LEVOTHROID  Take 50 mcg by mouth daily before breakfast.     pravastatin 40 MG tablet  Commonly known as:  PRAVACHOL  Take 40 mg by mouth every evening.     QUEtiapine 25 MG tablet  Commonly known as:  SEROQUEL  Take 50 mg by mouth at bedtime.     Vitamin D (Ergocalciferol) 50000 UNITS Caps capsule  Commonly known as:  DRISDOL  Take 50,000 Units by mouth every 7 (seven) days. Pt takes on Sunday.  Diagnostic Studies: Dg Chest 1 View  12/17/2014   CLINICAL DATA:  Fall today with LEFT hip pain.  LEFT hip fracture.  EXAM: CHEST  1 VIEW  COMPARISON:  Radiograph 07/23/2013  FINDINGS: Normal cardiac silhouette with ectatic aorta. No effusion, infiltrate, or pneumothorax.  IMPRESSION: No acute cardiopulmonary process.   Electronically Signed   By: Suzy Bouchard M.D.   On: 12/17/2014 17:06   Ct Head Wo Contrast  12/17/2014   CLINICAL DATA:  [place] and hit head today.  Headache.  EXAM: CT HEAD WITHOUT CONTRAST  TECHNIQUE: Contiguous axial images were obtained from the base of the skull through the vertex without intravenous contrast.  COMPARISON:  CT head 07/23/2013  FINDINGS: Mild to moderate generalized atrophy. Mild chronic microvascular ischemic change in the white matter  Negative for acute infarct.  Negative  for hemorrhage or mass  Chronic depressed fracture right zygomatic arch is unchanged. No acute skull fracture.  IMPRESSION: No acute intracranial abnormality.   Electronically Signed   By: Franchot Gallo M.D.   On: 12/17/2014 17:25   Dg Hip Port Unilat With Pelvis 1v Left  12/18/2014   CLINICAL DATA:  Status post open reduction and internal fixation of left hip fracture.  EXAM: DG HIP (WITH OR WITHOUT PELVIS) 1V PORT LEFT  COMPARISON:  December 17, 2014.  FINDINGS: Status post surgical internal fixation of intertrochanteric fracture of proximal left femur. Good alignment of fracture components is noted. No dislocation is noted.  IMPRESSION: Status post surgical internal fixation of proximal left femoral fracture.   Electronically Signed   By: Marijo Conception, M.D.   On: 12/18/2014 14:06   Dg Hip Operative Unilat With Pelvis Left  12/18/2014   CLINICAL DATA:  Fixation of a left intertrochanteric fracture. Intraoperative fluoroscopic spot films. Initial encounter.  EXAM: OPERATIVE LEFT HIP (WITH PELVIS IF PERFORMED) 2 VIEWS  TECHNIQUE: Fluoroscopic spot image(s) were submitted for interpretation post-operatively.  COMPARISON:  Plain films left hip 12/17/2014.  FINDINGS: Left hip screw and short intramedullary nail with a single distal interlocking screw is identified for fixation of an intertrochanteric fracture. Hardware is intact. Position and alignment are anatomic. No acute abnormality is identified.  IMPRESSION: ORIF left intertrochanteric fracture without evidence of complication.   Electronically Signed   By: Inge Rise M.D.   On: 12/18/2014 11:41   Dg Hip Unilat With Pelvis 2-3 Views Left  12/17/2014   CLINICAL DATA:  LEFT hip pain post fall  EXAM: DG HIP (WITH OR WITHOUT PELVIS) 2-3V LEFT  COMPARISON:  None  FINDINGS: Osseous demineralization.  Hip and SI joint spaces preserved and symmetric.  Minimally distracted intertrochanteric fracture LEFT femur.  No additional fracture, dislocation or bone  destruction identified.  Degenerative disc disease changes at visualized lower lumbar spine.  IMPRESSION: Intertrochanteric fracture LEFT femur.   Electronically Signed   By: Lavonia Dana M.D.   On: 12/17/2014 17:06    Disposition: SKILLED NURSING FACILITY   Continue 50% partial weightbearing on the LEFT lower extremity 1 month postop. Continue to use TED stockings until follow-up. Patient may remove them at night for sleep. Elevate the LEFT lower extremity whenever possible. May apply ice to surgical site.  Continue to work on hip range of motion exercises and lower extremity strengthening. Continue to use a walker for assistance with ambulation until follow-up.  Follow up with Dr. Mack Guise in the office 10-14 days after surgery.    Signed: Thornton Park ,MD 12/19/2014, 12:57 PM

## 2014-12-19 NOTE — Progress Notes (Signed)
Physical Therapy Treatment Patient Details Name: WISTER HOEFLE MRN: [id number] DOB: [birth year] Today's Date: 12/19/2014    History of Present Illness Pt with fall, L hip fx, ORIF    PT Comments    Pt is able to do some limited bed exercises and though he is confused he is able to follow basic instructions and shows effort with participation.  He is clearly sleepy and PT deferred sitting/standing this afternoon.  Follow Up Recommendations  SNF     Equipment Recommendations       Recommendations for Other Services       Precautions / Restrictions Precautions Precautions: Fall Restrictions Other Position/Activity Restrictions: WBAT    Mobility  Bed Mobility Overal bed mobility: Needs Assistance Bed Mobility: Sit to Supine       Sit to supine: Mod assist;Max assist   General bed mobility comments: deferred mobility/standing secondary to pt's lethargy and confusion  Transfers Overall transfer level: Needs assistance Equipment used: Rolling walker (2 wheeled) Transfers: Sit to/from Stand Sit to Stand: Max assist;Mod assist         General transfer comment: Pt leaning backwards and stuggles to rise, once up he is anxious and confused about what to do  Ambulation/Gait Ambulation/Gait assistance: Max assist Ambulation Distance (Feet): 2 Feet Assistive device: Rolling walker (2 wheeled)       General Gait Details: Pt struggles with recliner to bed "ambulation" needing heavy assist to turn hips and to insure safety.  He is confused and hesitant to use L LE for good WBing with walker.   Stairs            Wheelchair Mobility    Modified Rankin (Stroke Patients Only)       Balance                                    Cognition Arousal/Alertness: Lethargic   Overall Cognitive Status: Difficult to assess                      Exercises General Exercises - Lower Extremity Ankle Circles/Pumps: AROM;10 reps Quad Sets:  Strengthening;10 reps Short Arc Quad: AAROM;10 reps Heel Slides: 10 reps;AAROM;AROM Hip ABduction/ADduction: AAROM;10 reps    General Comments        Pertinent Vitals/Pain Pain Assessment:  (only indicates pain with full hip ABd)    Home Living Family/patient expects to be discharged to:: Private residence Living Arrangements:  (confused, unable to answer most questions appropriatley)                  Prior Function Level of Independence:  (difficult to assess secondary to mental status)          PT Goals (current goals can now be found in the care plan section) Acute Rehab PT Goals Patient Stated Goal: none stated PT Goal Formulation: With patient Time For Goal Achievement: 01/02/15 Potential to Achieve Goals: Fair Progress towards PT goals: PT to reassess next treatment    Frequency  BID    PT Plan Current plan remains appropriate    Co-evaluation             End of Session Equipment Utilized During Treatment: Gait belt Activity Tolerance:  (increased pain with exercises, pt confused) Patient left: with bed alarm set     Time: 5188-4166 PT Time Calculation (min) (ACUTE ONLY): 14 min  Charges:  $Therapeutic Exercise: 8-22  mins                    G Codes:     [name], [place], DPT [phone]  [name] 12/19/2014, 3:11 PM

## 2014-12-19 NOTE — Evaluation (Signed)
Occupational Therapy Evaluation Patient Details Name: Barry Coffey MRN: [id number] DOB: 1926/03/09 Today's Date: 12/19/2014    History of Present Illness This patient is an 81 year old male who came to [facility] after a fall resulting in a left hip fracture. He had an ORIF repair.   Clinical Impression   Patient confused and not sure teaching will stay with patient. He would benefit from Occupational Therapy for ADL/functioal mobility training if cognition improves or family/caregiver teaching if it does not.    Follow Up Recommendations  SNF    Equipment Recommendations       Recommendations for Other Services       Precautions / Restrictions Precautions Precautions: Fall Restrictions Weight Bearing Restrictions: Yes LLE Weight Bearing: Partial weight bearing Other Position/Activity Restrictions: WBAT      Mobility Bed Mobility Overal bed mobility: Needs Assistance Bed Mobility: Sit to Supine       Sit to supine: Mod assist;Max assist   General bed mobility comments: deferred mobility/standing secondary to pt's lethargy and confusion  Transfers Overall transfer level: Needs assistance Equipment used: Rolling walker (2 wheeled) Transfers: Sit to/from Stand Sit to Stand: Max assist;Mod assist         General transfer comment: Pt leaning backwards and stuggles to rise, once up he is anxious and confused about what to do    Balance                                            ADL                                         General ADL Comments: Patient is max assist with lower body dressing. Did practice lower body dressing using hip kit with hand over hand assist.     Vision     Perception     Praxis      Pertinent Vitals/Pain      Hand Dominance     Extremity/Trunk Assessment Upper Extremity Assessment Upper Extremity Assessment:  (B UE 4-/5)         Communication Communication Communication: No  difficulties   Cognition Arousal/Alertness:  (Alert but confused) Behavior During Therapy: WFL for tasks assessed/performed (pleasant) Overall Cognitive Status:  (oriented to person )                     General Comments       Exercises Exercises: General Lower Extremity     Shoulder Instructions      Home Living Family/patient expects to be discharged to:: Private residence Living Arrangements:  (Patient unable to state secondary to confusion)                                      Prior Functioning/Environment Level of Independence:  (Unable to assess)             OT Diagnosis: Generalized weakness;Acute pain   OT Problem List: Decreased strength;Decreased range of motion;Decreased activity tolerance;Impaired balance (sitting and/or standing);Decreased cognition;Decreased safety awareness;Pain   OT Treatment/Interventions: Self-care/ADL training (Family and care giver teaching)    OT Goals(Current goals can be found in the care plan section) Acute Rehab OT  Goals Patient Stated Goal: none stated Time For Goal Achievement: 01/02/15  OT Frequency: Min 1X/week   Barriers to D/C:            Co-evaluation              End of Session Equipment Utilized During Treatment:  (hip kit)  Activity Tolerance: Patient tolerated treatment well Patient left: in bed;with call bell/phone within reach;with bed alarm set   Time: 1555-1610 OT Time Calculation (min): 15 min Charges:  OT General Charges $OT Visit: 1 Procedure OT Evaluation $Initial OT Evaluation Tier I: 1 Procedure G-Codes:    Myrene Galas, MS/OTR/L  12/19/2014, 4:20 PM

## 2014-12-20 LAB — CBC WITH DIFFERENTIAL/PLATELET
BASOS ABS: 0 10*3/uL (ref 0–0.1)
BASOS PCT: 0 %
Eosinophils Absolute: 0.1 10*3/uL (ref 0–0.7)
Eosinophils Relative: 1 %
HEMATOCRIT: 28.4 % — AB (ref 40.0–52.0)
HEMOGLOBIN: 9.5 g/dL — AB (ref 13.0–18.0)
LYMPHS PCT: 7 %
Lymphs Abs: 0.7 10*3/uL — ABNORMAL LOW (ref 1.0–3.6)
MCH: 32.1 pg (ref 26.0–34.0)
MCHC: 33.4 g/dL (ref 32.0–36.0)
MCV: 96.1 fL (ref 80.0–100.0)
MONO ABS: 1.2 10*3/uL — AB (ref 0.2–1.0)
Monocytes Relative: 11 %
NEUTROS ABS: 8.4 10*3/uL — AB (ref 1.4–6.5)
NEUTROS PCT: 81 %
Platelets: 131 10*3/uL — ABNORMAL LOW (ref 150–440)
RBC: 2.95 MIL/uL — ABNORMAL LOW (ref 4.40–5.90)
RDW: 13.7 % (ref 11.5–14.5)
WBC: 10.5 10*3/uL (ref 3.8–10.6)

## 2014-12-20 LAB — URINE CULTURE: CULTURE: NO GROWTH

## 2014-12-20 LAB — BASIC METABOLIC PANEL
ANION GAP: 7 (ref 5–15)
BUN: 16 mg/dL (ref 6–20)
CALCIUM: 8.5 mg/dL — AB (ref 8.9–10.3)
CO2: 26 mmol/L (ref 22–32)
Chloride: 106 mmol/L (ref 101–111)
Creatinine, Ser: 1.18 mg/dL (ref 0.61–1.24)
GFR, EST NON AFRICAN AMERICAN: 54 mL/min — AB (ref >60)
GLUCOSE: 117 mg/dL — AB (ref 65–99)
POTASSIUM: 3.7 mmol/L (ref 3.5–5.1)
Sodium: 139 mmol/L (ref 135–145)

## 2014-12-20 MED ORDER — HALOPERIDOL LACTATE 5 MG/ML IJ SOLN
2.0000 mg | Freq: Once | INTRAMUSCULAR | Status: AC
  Administered 2014-12-20: 2 mg via INTRAVENOUS
  Filled 2014-12-20: qty 1

## 2014-12-20 MED ORDER — HALOPERIDOL LACTATE 5 MG/ML IJ SOLN: 1.0000 mg | Freq: Four times a day (QID) | INTRAMUSCULAR | Status: DC | PRN

## 2014-12-20 NOTE — Progress Notes (Signed)
Patient has been agitated & trying to get out of his chair. Dava called Dr. Ola Spurr and he ordered haldol for patient. 2mg  dose now & then q6prn.  Haldol has helped and he is more relaxed and resting quietly.

## 2014-12-20 NOTE — Progress Notes (Signed)
Subjective: 2 Days Post-Op Procedure(s) (LRB): INTRAMEDULLARY (IM) NAIL FEMORAL (Left)    Patient reports pain as mild. Agitated and confused.  Medicine will Rx for this.  Wants to go home.    Objective:   VITALS:   Filed Vitals:   12/20/14 0808  BP: 108/68  Pulse: 59  Temp: 98.9 F (37.2 C)  Resp: 18    Neurologically intact Sensation intact distally Intact pulses distally Dorsiflexion/Plantar flexion intact No cellulitis present Dressing dry.  No drainage.   Hgb stable.  LABS  Recent Labs  12/18/14 0708 12/19/14 0716 12/20/14 0428  HGB 11.2* 9.9* 9.5*  HCT 33.5* 30.0* 28.4*  WBC 9.0 11.6* 10.5  PLT 161 132* 131*     Recent Labs  12/18/14 0708 12/19/14 0716 12/20/14 0428  NA 137 136 139  K 3.9 4.0 3.7  BUN 19 14 16   CREATININE 1.04 1.15 1.18  GLUCOSE 111* 131* 117*     Recent Labs  12/17/14 1614 12/18/14 0708  INR 1.15 1.27     Assessment/Plan: 2 Days Post-Op Procedure(s) (LRB): INTRAMEDULLARY (IM) NAIL FEMORAL (Left)   Up with therapy Discharge to SNF when available.

## 2014-12-20 NOTE — Progress Notes (Signed)
PROGRESS NOTE  NEPHI SAVAGE TIW:[id number] DOB: February 05, 1926 DOA: 12/17/2014 PCP: Tracie Harrier, MD  Subjective 81 y/o male with hx of Depression, Dementia, hyperlipidemia, COPD admitted with  Left Hip inter trochantric fracture after a fall- s/p ORIF. Doing well this am. Denies pain Consultants:  Ortho;Dr.Krasinski   Objective: BP 120/62 mmHg  Pulse 69  Temp(Src) 98.8 F (37.1 C) (Axillary)  Resp 18  Ht 5\' 10"  (1.778 m)  Wt 75.025 kg (165 lb 6.4 oz)  SpO2 100%  Intake/Output Summary (Last 24 hours) at 12/20/14 1616 Last data filed at 12/20/14 1500  Gross per 24 hour  Intake 2204.16 ml  Output    550 ml  Net 1654.16 ml   Filed Weights   12/17/14 2044  Weight: 75.025 kg (165 lb 6.4 oz)    Exam:  General: Not in distress HEENT: PERRL; OP moist without lesions. Neck: supple, trachea midline, no thyromegaly Chest: normal to palpation Lungs: clear bilaterally without retractions or wheezes Cardiovascular: RRR, no murmur, no gallop;  Abdomen: soft, nontender, nondistended, positive bowel sounds Extremities: Left hip  Mild ecchymosis . Incision looks ok Neuro: alert and oriented, moves all extremities Derm: no significant rashes or nodules; good skin turgor Lymph: no cervical or supraclavicular lymphadenopathy   Labs and imaging studies were reviewed*  Data Reviewed: Basic Metabolic Panel:  Recent Labs Lab 12/17/14 1614 12/18/14 0708 12/19/14 0716 12/20/14 0428  NA 137 137 136 139  K 4.3 3.9 4.0 3.7  CL 104 105 103 106  CO2 26 24 26 26   GLUCOSE 127* 111* 131* 117*  BUN 20 19 14 16   CREATININE 1.16 1.04 1.15 1.18  CALCIUM 8.8* 8.5* 8.4* 8.5*   Liver Function Tests:  Recent Labs Lab 12/17/14 2319  ALBUMIN 3.7   No results for input(s): LIPASE, AMYLASE in the last 168 hours. No results for input(s): AMMONIA in the last 168 hours. CBC:  Recent Labs Lab 12/17/14 1614 12/17/14 2319 12/18/14 0708 12/19/14 0716 12/20/14 0428  WBC 7.4  13.1* 9.0 11.6* 10.5  NEUTROABS 6.1 11.3*  --  9.5* 8.4*  HGB 12.3* 12.1* 11.2* 9.9* 9.5*  HCT 37.0* 37.2* 33.5* 30.0* 28.4*  MCV 96.8 96.7 96.0 96.3 96.1  PLT 202 182 161 132* 131*   Cardiac Enzymes:   No results for input(s): CKTOTAL, CKMB, CKMBINDEX, TROPONINI in the last 168 hours. BNP (last 3 results) No results for input(s): BNP in the last 8760 hours.  ProBNP (last 3 results) No results for input(s): PROBNP in the last 8760 hours.  CBG:  Recent Labs Lab 12/18/14 0728  GLUCAP 112*    Recent Results (from the past 240 hour(s))  Urine culture     Status: None   Collection Time: 12/18/14  8:36 AM  Result Value Ref Range Status   Specimen Description URINE, CLEAN CATCH  Final   Special Requests NONE  Final   Culture NO GROWTH 2 DAYS  Final   Report Status 12/20/2014 FINAL  Final  Surgical pcr screen     Status: None   Collection Time: 12/18/14  8:36 AM  Result Value Ref Range Status   MRSA, PCR NEGATIVE NEGATIVE Final   Staphylococcus aureus NEGATIVE NEGATIVE Final    Comment:        The Xpert SA Assay (FDA approved for NASAL specimens in patients over 72 years of age), is one component of a comprehensive surveillance program.  Test performance has been validated by [facility] for patients greater than or equal to 1  year old. It is not intended to diagnose infection nor to guide or monitor treatment.      Studies: No results found.  Scheduled Meds: . citalopram  30 mg Oral Daily  . docusate sodium  100 mg Oral BID  . donepezil  10 mg Oral QHS  . enoxaparin (LOVENOX) injection  30 mg Subcutaneous Q12H  . ferrous sulfate  325 mg Oral TID PC  . levothyroxine  50 mcg Oral QAC breakfast  . pravastatin  40 mg Oral QPM  . QUEtiapine  50 mg Oral QHS  . [START ON 12/21/2014] Vitamin D (Ergocalciferol)  50,000 Units Oral Q7 days    Continuous Infusions: . 0.9 % NaCl with KCl 20 mEq / L 50 mL/hr at 12/18/14 1507    Assessment/Plan: 1 Fall with left  Intertrochanteric Hip fracture- s/p ORIF Continue pain management. Physical Therapy 2 Post-op Anemia;  Hgb; stable 9.5  -Continue to monitor 3 Depression: On Citalopram 4 Dementia; On Aricept and Quetiapine 5 For Rehab soon   Code Status: Full     12/20/2014, 4:16 PM  LOS: 3 days

## 2014-12-20 NOTE — Progress Notes (Signed)
Physical Therapy Treatment Patient Details Name: TAELYN NEMES MRN: [id number] DOB: 07-04-1925 Today's Date: 12/20/2014    History of Present Illness This patient is an 81 year old male who came to [facility] after a fall resulting in a left hip fracture. He had an ORIF repair.    PT Comments    Pt with continued confusion, impulsiveness and general agitation.  He continues to struggle with coordinating any meaningful steppage while in standing with much assist.  He is unable to stay awake with exercises and c/o pain with mostly PROM activities.  Follow Up Recommendations  SNF     Equipment Recommendations  Rolling walker with 5" wheels    Recommendations for Other Services       Precautions / Restrictions Precautions Precautions: Fall Restrictions LLE Weight Bearing: Partial weight bearing    Mobility  Bed Mobility Overal bed mobility: Needs Assistance Bed Mobility: Sit to Supine;Supine to Sit       Sit to supine: Max assist      Transfers Overall transfer level: Needs assistance Equipment used: Rolling walker (2 wheeled)   Sit to Stand: Max assist;Total assist         General transfer comment: Pt remains very confused and limited, unable to get to fully upright despite much cuing and assist.  Pt still unable to meaningfully take steps  Ambulation/Gait                 Stairs            Wheelchair Mobility    Modified Rankin (Stroke Patients Only)       Balance                                    Cognition Arousal/Alertness: Lethargic (pt falling asleep during exercises) Behavior During Therapy: Impulsive;Agitated                        Exercises General Exercises - Lower Extremity Ankle Circles/Pumps: PROM;5 reps Quad Sets: Strengthening;10 reps Short Arc Quad: AAROM;10 reps Heel Slides: 10 reps;AAROM;AROM Hip ABduction/ADduction: AAROM;10 reps    General Comments        Pertinent Vitals/Pain       Home Living                      Prior Function            PT Goals (current goals can now be found in the care plan section) Progress towards PT goals: Not progressing toward goals - comment (pt confusion and lethargy very limiting)    Frequency  BID (per ability to participate)    PT Plan Current plan remains appropriate    Co-evaluation             End of Session Equipment Utilized During Treatment: Gait belt Activity Tolerance: Treatment limited secondary to agitation Patient left: with bed alarm set     Time: 1420-1431 PT Time Calculation (min) (ACUTE ONLY): 11 min  Charges:  $Therapeutic Exercise: 8-22 mins                    G Codes:     [name], PT, DPT [phone]  [name] 12/20/2014, 3:30 PM

## 2014-12-20 NOTE — Progress Notes (Signed)
Physical Therapy Treatment Patient Details Name: SANJITH SIWEK MRN: [id number] DOB: 03-Jul-1925 Today's Date: 12/20/2014    History of Present Illness This patient is an 81 year old male who came to [facility] after a fall resulting in a left hip fracture. He had an ORIF repair.    PT Comments    Pt continues to be quite confused and though he does show some effort with AAROM LE exercises he is unable to meaningfully follow instructions and shows inability to take even one small step with heavy cuing and assist.  Follow Up Recommendations  SNF     Equipment Recommendations  Rolling walker with 5" wheels    Recommendations for Other Services       Precautions / Restrictions Precautions Precautions: Fall Restrictions Weight Bearing Restrictions: Yes LLE Weight Bearing: Partial weight bearing    Mobility  Bed Mobility Overal bed mobility: Needs Assistance Bed Mobility: Sit to Supine;Supine to Sit     Supine to sit: Max assist;Mod assist        Transfers Overall transfer level: Needs assistance Equipment used: Rolling walker (2 wheeled)   Sit to Stand: Max assist;Mod assist         General transfer comment: Pt remains very confused and limited, unable to get to fully upright despite much cuing and assist  Ambulation/Gait Ambulation/Gait assistance:  (dependent assist bed to recliner, no meaningful steppage)               Stairs            Wheelchair Mobility    Modified Rankin (Stroke Patients Only)       Balance                                    Cognition Arousal/Alertness: Lethargic Behavior During Therapy: Impulsive (pt even more confused than previous sessions)                        Exercises General Exercises - Lower Extremity Ankle Circles/Pumps: AROM;10 reps Quad Sets: Strengthening;10 reps Short Arc Quad: AAROM;10 reps Heel Slides: 10 reps;AAROM;AROM Hip ABduction/ADduction: AAROM;10 reps    General  Comments        Pertinent Vitals/Pain      Home Living                      Prior Function            PT Goals (current goals can now be found in the care plan section) Progress towards PT goals: Progressing toward goals    Frequency  BID (per ability to participate)    PT Plan Current plan remains appropriate    Co-evaluation             End of Session Equipment Utilized During Treatment: Gait belt Activity Tolerance: Patient tolerated treatment well Patient left: with chair alarm set     Time: 4503-8882 PT Time Calculation (min) (ACUTE ONLY): 25 min  Charges:  $Therapeutic Exercise: 8-22 mins $Therapeutic Activity: 8-22 mins                    G Codes:     [name], PT, DPT [phone]  [name] 12/20/2014, 10:39 AM

## 2014-12-20 NOTE — Progress Notes (Signed)
rn spoke with dr fitzgerald d/t pt agitated,confused and attempting to get oob. md orders haldol 1mg  q6hr  Iv prn and haldol 2mg  iv once now

## 2014-12-21 LAB — CBC
HEMATOCRIT: 26.5 % — AB (ref 40.0–52.0)
Hemoglobin: 8.9 g/dL — ABNORMAL LOW (ref 13.0–18.0)
MCH: 32.2 pg (ref 26.0–34.0)
MCHC: 33.6 g/dL (ref 32.0–36.0)
MCV: 96 fL (ref 80.0–100.0)
PLATELETS: 142 10*3/uL — AB (ref 150–440)
RBC: 2.76 MIL/uL — AB (ref 4.40–5.90)
RDW: 13.5 % (ref 11.5–14.5)
WBC: 8.5 10*3/uL (ref 3.8–10.6)

## 2014-12-21 MED ORDER — HYDROCODONE-ACETAMINOPHEN 5-325 MG PO TABS: 1.0000 | ORAL_TABLET | Freq: Four times a day (QID) | ORAL | Status: DC | PRN

## 2014-12-21 MED ORDER — BISACODYL 10 MG RE SUPP: 10.0000 mg | Freq: Every day | RECTAL | Status: DC | PRN

## 2014-12-21 MED ORDER — MAGNESIUM HYDROXIDE 400 MG/5ML PO SUSP: 30.0000 mL | Freq: Every day | ORAL | Status: DC | PRN

## 2014-12-21 MED ORDER — FERROUS SULFATE 325 (65 FE) MG PO TABS: 325.0000 mg | ORAL_TABLET | Freq: Every day | ORAL | Status: AC

## 2014-12-21 MED ORDER — ENOXAPARIN SODIUM 40 MG/0.4ML ~~LOC~~ SOLN: 40.0000 mg | SUBCUTANEOUS | Status: DC

## 2014-12-21 MED ORDER — ALUM & MAG HYDROXIDE-SIMETH 200-200-20 MG/5ML PO SUSP: 30.0000 mL | ORAL | Status: DC | PRN

## 2014-12-21 MED ORDER — DOCUSATE SODIUM 100 MG PO CAPS: 100.0000 mg | ORAL_CAPSULE | Freq: Two times a day (BID) | ORAL | Status: DC

## 2014-12-21 NOTE — Progress Notes (Signed)
PROGRESS NOTE  JAAMAL Coffey EZM:[id number] DOB: 20-Jul-1925 DOA: 12/17/2014 PCP: Tracie Harrier, MD  Subjective 81 y/o male with hx of Depression, Dementia, hyperlipidemia, COPD admitted with  Left Hip inter trochantric fracture after a fall- s/p ORIF. Doing well this am. Denies pain Consultants:  Ortho;Dr.Krasinski   Objective: BP 119/62 mmHg  Pulse 82  Temp(Src) 98.1 F (36.7 C) (Oral)  Resp 18  Ht 5\' 10"  (1.778 m)  Wt 75.025 kg (165 lb 6.4 oz)  SpO2 97%  Intake/Output Summary (Last 24 hours) at 12/21/14 1038 Last data filed at 12/21/14 0539  Gross per 24 hour  Intake      0 ml  Output    350 ml  Net   -350 ml   Filed Weights   12/17/14 2044  Weight: 75.025 kg (165 lb 6.4 oz)    Exam:  General: Not in distress HEENT: PERRL; OP moist without lesions. Neck: supple, trachea midline, no thyromegaly Chest: normal to palpation Lungs: clear bilaterally without retractions or wheezes Cardiovascular: RRR, no murmur, no gallop;  Abdomen: soft, nontender, nondistended, positive bowel sounds Extremities: Left hip  Mild ecchymosis . Incision looks ok Neuro: alert and oriented, moves all extremities Derm: no significant rashes or nodules; good skin turgor Lymph: no cervical or supraclavicular lymphadenopathy   Labs and imaging studies were reviewed*  Data Reviewed: Basic Metabolic Panel:  Recent Labs Lab 12/17/14 1614 12/18/14 0708 12/19/14 0716 12/20/14 0428  NA 137 137 136 139  K 4.3 3.9 4.0 3.7  CL 104 105 103 106  CO2 26 24 26 26   GLUCOSE 127* 111* 131* 117*  BUN 20 19 14 16   CREATININE 1.16 1.04 1.15 1.18  CALCIUM 8.8* 8.5* 8.4* 8.5*   Liver Function Tests:  Recent Labs Lab 12/17/14 2319  ALBUMIN 3.7   No results for input(s): LIPASE, AMYLASE in the last 168 hours. No results for input(s): AMMONIA in the last 168 hours. CBC:  Recent Labs Lab 12/17/14 1614 12/17/14 2319 12/18/14 0708 12/19/14 0716 12/20/14 0428 12/21/14 0429   WBC 7.4 13.1* 9.0 11.6* 10.5 8.5  NEUTROABS 6.1 11.3*  --  9.5* 8.4*  --   HGB 12.3* 12.1* 11.2* 9.9* 9.5* 8.9*  HCT 37.0* 37.2* 33.5* 30.0* 28.4* 26.5*  MCV 96.8 96.7 96.0 96.3 96.1 96.0  PLT 202 182 161 132* 131* 142*   Cardiac Enzymes:   No results for input(s): CKTOTAL, CKMB, CKMBINDEX, TROPONINI in the last 168 hours. BNP (last 3 results) No results for input(s): BNP in the last 8760 hours.  ProBNP (last 3 results) No results for input(s): PROBNP in the last 8760 hours.  CBG:  Recent Labs Lab 12/18/14 0728  GLUCAP 112*    Recent Results (from the past 240 hour(s))  Urine culture     Status: None   Collection Time: 12/18/14  8:36 AM  Result Value Ref Range Status   Specimen Description URINE, CLEAN CATCH  Final   Special Requests NONE  Final   Culture NO GROWTH 2 DAYS  Final   Report Status 12/20/2014 FINAL  Final  Surgical pcr screen     Status: None   Collection Time: 12/18/14  8:36 AM  Result Value Ref Range Status   MRSA, PCR NEGATIVE NEGATIVE Final   Staphylococcus aureus NEGATIVE NEGATIVE Final    Comment:        The Xpert SA Assay (FDA approved for NASAL specimens in patients over 56 years of age), is one component of a comprehensive surveillance program.  Test performance has been validated by [facility] for patients greater than or equal to 66 year old. It is not intended to diagnose infection nor to guide or monitor treatment.      Studies: No results found.  Scheduled Meds: . citalopram  30 mg Oral Daily  . docusate sodium  100 mg Oral BID  . donepezil  10 mg Oral QHS  . enoxaparin (LOVENOX) injection  30 mg Subcutaneous Q12H  . ferrous sulfate  325 mg Oral TID PC  . levothyroxine  50 mcg Oral QAC breakfast  . pravastatin  40 mg Oral QPM  . QUEtiapine  50 mg Oral QHS  . Vitamin D (Ergocalciferol)  50,000 Units Oral Q7 days    Continuous Infusions: . 0.9 % NaCl with KCl 20 mEq / L 50 mL/hr at 12/18/14 1507    Assessment/Plan: 1  Fall with left Intertrochanteric Hip fracture- s/p ORIF Continue pain management. Physical Therapy 2 Post-op Anemia;  Hgb; down to 8.9  -Continue to monitor 3 Depression: On Citalopram 4 Dementia; On Aricept and Quetiapine 5 For Rehab soon   Code Status: Full     12/21/2014, 10:38 AM  LOS: 4 days

## 2014-12-21 NOTE — Discharge Summary (Signed)
Physician Discharge Summary  Patient ID: Barry Coffey MRN: [id number] DOB/AGE: [birth year] 81 y.o.  Admit date: 12/17/2014 Discharge date: 12/21/2014  Admission Diagnoses:  Discharge Diagnoses:  Active Problems:   Hip fracture   Discharged Condition: fair  Hospital Course: 1 Fall with left Intertrochanteric Hip fracture- s/p ORIF Continue pain management - norco Physical Therapy 2 Post-op Anemia; Hgb; stable 9.5 -Continue to monitor 3 Depression: On Citalopram 4 Dementia; On Aricept and Quetiapine 5 For Rehab soon  Consults: Ortho  Discharge Exam: Blood pressure 119/62, pulse 82, temperature 98.1 F (36.7 C), temperature source Oral, resp. rate 18, height 5\' 10"  (1.778 m), weight 75.025 kg (165 lb 6.4 oz), SpO2 97 %.  Disposition: [name]    Medication List    TAKE these medications        alum & mag hydroxide-simeth 200-200-20 MG/5ML suspension  Commonly known as:  MAALOX/MYLANTA  Take 30 mLs by mouth every 4 (four) hours as needed for indigestion.     bisacodyl 10 MG suppository  Commonly known as:  DULCOLAX  Place 1 suppository (10 mg total) rectally daily as needed for moderate constipation.     citalopram 10 MG tablet  Commonly known as:  CELEXA  Take 30 mg by mouth daily.     docusate sodium 100 MG capsule  Commonly known as:  COLACE  Take 1 capsule (100 mg total) by mouth 2 (two) times daily.     donepezil 10 MG tablet  Commonly known as:  ARICEPT  Take 10 mg by mouth at bedtime.     enoxaparin 40 MG/0.4ML injection  Commonly known as:  LOVENOX  Inject 0.4 mLs (40 mg total) into the skin daily.     ferrous sulfate 325 (65 FE) MG tablet  Take 1 tablet (325 mg total) by mouth daily with breakfast.     HYDROcodone-acetaminophen 5-325 MG per tablet  Commonly known as:  NORCO  Take 1 tablet by mouth every 6 (six) hours as needed for moderate pain.     levothyroxine 50 MCG tablet  Commonly known as:  SYNTHROID, LEVOTHROID  Take 50  mcg by mouth daily before breakfast.     magnesium hydroxide 400 MG/5ML suspension  Commonly known as:  MILK OF MAGNESIA  Take 30 mLs by mouth daily as needed for mild constipation.     pravastatin 40 MG tablet  Commonly known as:  PRAVACHOL  Take 40 mg by mouth every evening.     QUEtiapine 25 MG tablet  Commonly known as:  SEROQUEL  Take 50 mg by mouth at bedtime.     Vitamin D (Ergocalciferol) 50000 UNITS Caps capsule  Commonly known as:  DRISDOL  Take 50,000 Units by mouth every 7 (seven) days. Pt takes on Sunday.           Follow-up Information    Follow up with [facility] [place], MD In 1 week.   Specialty:  Internal Medicine   Contact information:   [place] [address] [phone]       Follow up with Thornton Park, MD In 1 week.   Specialty:  Orthopedic Surgery   Contact information:   [place] [address] [phone]      Signed: [place], [place] 12/21/2014, 10:49 AM

## 2014-12-21 NOTE — Progress Notes (Signed)
Physical Therapy Treatment Patient Details Name: Barry Coffey MRN: [id number] DOB: [birth year] Today's Date: 12/21/2014    History of Present Illness This patient is an 81 year old male who came to [facility] after a fall resulting in a left hip fracture. He had an ORIF repair.    PT Comments    Pt continues to be very confused, but he was pleasant today and PT was able to keep him on task a little easier.  He still very much struggles with standing balance and attempts at taking steps but needs less aggressive assist to remain upright today.  Follow Up Recommendations  SNF     Equipment Recommendations  Rolling walker with 5" wheels    Recommendations for Other Services       Precautions / Restrictions Precautions Precautions: Fall Restrictions LLE Weight Bearing: Partial weight bearing    Mobility  Bed Mobility Overal bed mobility: Needs Assistance Bed Mobility: Sit to Supine;Supine to Sit     Supine to sit: Max assist        Transfers Overall transfer level: Needs assistance Equipment used: Rolling walker (2 wheeled)   Sit to Stand: Max assist;Total assist         General transfer comment: Unable to shift weight forward enough to stand w/o heavy assist to keep hips forward.    Ambulation/Gait             General Gait Details: unable to really do any walking, able to shift feet minimally with heavy assist    Stairs            Wheelchair Mobility    Modified Rankin (Stroke Patients Only)       Balance                                    Cognition Arousal/Alertness: Lethargic (pt falling asleep during exercises) Behavior During Therapy: Impulsive;Restless                        Exercises General Exercises - Lower Extremity Ankle Circles/Pumps: PROM;5 reps Quad Sets: Strengthening;10 reps Short Arc Quad: AAROM;10 reps Heel Slides: 10 reps;AAROM;AROM Hip ABduction/ADduction: AAROM;10 reps    General Comments         Pertinent Vitals/Pain      Home Living                      Prior Function            PT Goals (current goals can now be found in the care plan section) Progress towards PT goals: Progressing toward goals    Frequency  BID (per ability to participate)    PT Plan Current plan remains appropriate    Co-evaluation             End of Session Equipment Utilized During Treatment: Gait belt Activity Tolerance: Treatment limited secondary to agitation Patient left: with bed alarm set     Time: 6433-2951 PT Time Calculation (min) (ACUTE ONLY): 26 min  Charges:  $Therapeutic Exercise: 8-22 mins $Therapeutic Activity: 8-22 mins                    G Codes:     Barry Coffey, PT, DPT [phone]  Barry Coffey 12/21/2014, 11:44 AM

## 2014-12-21 NOTE — Progress Notes (Signed)
Patient being discharged & transferred to [facility] via EMS. Report was called to [facility].  IV has been removed & patient's belongs have been packed.  VSS at time of discharge. Awaiting EMS.

## 2014-12-21 NOTE — Progress Notes (Signed)
Subjective: 3 Days Post-Op Procedure(s) (LRB): INTRAMEDULLARY (IM) NAIL FEMORAL (Left)    Patient reports pain as Remains confused..  Objective:   VITALS:   Filed Vitals:   12/21/14 0820  BP: 119/62  Pulse: 82  Temp: 98.1 F (36.7 C)  Resp: 18    Neurovascular intact Sensation intact distally Intact pulses distally Dorsiflexion/Plantar flexion intact No cellulitis present dressings dry.  rom of hip good.  LABS  Recent Labs  12/19/14 0716 12/20/14 0428 12/21/14 0429  HGB 9.9* 9.5* 8.9*  HCT 30.0* 28.4* 26.5*  WBC 11.6* 10.5 8.5  PLT 132* 131* 142*     Recent Labs  12/19/14 0716 12/20/14 0428  NA 136 139  K 4.0 3.7  BUN 14 16  CREATININE 1.15 1.18  GLUCOSE 131* 117*    No results for input(s): LABPT, INR in the last 72 hours.   Assessment/Plan: 3 Days Post-Op Procedure(s) (LRB): INTRAMEDULLARY (IM) NAIL FEMORAL (Left)   Up with therapy Discharge to SNF when available

## 2014-12-21 NOTE — Clinical Social Work Note (Signed)
Patient is to discharge today to [facility]. CSW has notified Crystal at [facility] and discharge summary has been sent. Nurse to call report. Patient's daughter in law, [name], is aware and wishes for transport via EMS.  Shela Leff MSW,LCSW [phone]

## 2014-12-23 ENCOUNTER — Non-Acute Institutional Stay (SKILLED_NURSING_FACILITY): Payer: Medicare Other | Admitting: Internal Medicine

## 2014-12-23 DIAGNOSIS — F0390 Unspecified dementia without behavioral disturbance: Secondary | ICD-10-CM

## 2014-12-23 DIAGNOSIS — K5900 Constipation, unspecified: Secondary | ICD-10-CM | POA: Diagnosis not present

## 2014-12-23 DIAGNOSIS — D62 Acute posthemorrhagic anemia: Secondary | ICD-10-CM | POA: Diagnosis not present

## 2014-12-23 DIAGNOSIS — F0393 Unspecified dementia, unspecified severity, with mood disturbance: Secondary | ICD-10-CM

## 2014-12-23 DIAGNOSIS — R2681 Unsteadiness on feet: Secondary | ICD-10-CM | POA: Diagnosis not present

## 2014-12-23 DIAGNOSIS — F0280 Dementia in other diseases classified elsewhere without behavioral disturbance: Secondary | ICD-10-CM

## 2014-12-23 DIAGNOSIS — S72142S Displaced intertrochanteric fracture of left femur, sequela: Secondary | ICD-10-CM | POA: Diagnosis not present

## 2014-12-23 DIAGNOSIS — F329 Major depressive disorder, single episode, unspecified: Secondary | ICD-10-CM | POA: Diagnosis not present

## 2014-12-23 DIAGNOSIS — R42 Dizziness and giddiness: Secondary | ICD-10-CM

## 2014-12-23 NOTE — Progress Notes (Signed)
Patient ID: Barry Coffey, male   DOB: 30-Jun-1925, 81 y.o.   MRN: [id number]     Facility: [facility]    PCP: Tracie Harrier, MD  Code Status: full code  No Known Allergies  Chief Complaint  Patient presents with  . New Admit To SNF     HPI:  81 y.o. patient is here for short term rehabilitation post hospital admission from 12/17/14-12/21/14 post fall with left intertrochanteric hip fracture. He underwent open reduction and internal fixation. He is seen in his room today. His pain is under control with current regimen. He complaints of feeling cloudy in his head. Denies any other concerns.   Review of Systems:  Constitutional: Negative for fever, chills, diaphoresis.  HENT: Negative for headache, congestion, nasal discharge, sore throat, difficulty swallowing.   Eyes: Negative for eye pain, blurred vision, double vision and discharge.  Respiratory: Negative for cough, shortness of breath and wheezing.   Cardiovascular: Negative for chest pain, palpitations, leg swelling.  Gastrointestinal: Negative for heartburn, nausea, vomiting, abdominal pain. Had bowel movement yesterday Genitourinary: Negative for dysuria, flank pain.  Musculoskeletal: Negative for back pain, falls in facility Skin: Negative for itching, rash.  Neurological: Negative for dizziness, tingling, focal weakness Psychiatric/Behavioral: Negative for depression, anxiety, insomnia and memory loss.    Past Medical History  Diagnosis Date  . Hyperlipidemia   . Dementia   . Depression   . Anxiety   . Bladder cancer 2011    s/p resection with mitomycin injection;Dr.Stoioff  . COPD (chronic obstructive pulmonary disease)    Past Surgical History  Procedure Laterality Date  . Back surgery      x4  . Cholecystectomy    . Foot surgery    . Femur im nail Left 12/18/2014    Procedure: INTRAMEDULLARY (IM) NAIL FEMORAL;  Surgeon: Thornton Park, MD;  Location: [facility] ORS;  Service:  Orthopedics;  Laterality: Left;   Social History:   reports that he has quit smoking. His smoking use included Cigarettes. He has a 37.5 pack-year smoking history. He does not have any smokeless tobacco history on file. He reports that he does not drink alcohol or use illicit drugs.  Family History  Problem Relation Age of Onset  . Hypertension Father     Medications:   Medication List       This list is accurate as of: 12/23/14  8:40 PM.  Always use your most recent med list.               alum & mag hydroxide-simeth 200-200-20 MG/5ML suspension  Commonly known as:  MAALOX/MYLANTA  Take 30 mLs by mouth every 4 (four) hours as needed for indigestion.     bisacodyl 10 MG suppository  Commonly known as:  DULCOLAX  Place 1 suppository (10 mg total) rectally daily as needed for moderate constipation.     citalopram 10 MG tablet  Commonly known as:  CELEXA  Take 30 mg by mouth daily.     docusate sodium 100 MG capsule  Commonly known as:  COLACE  Take 1 capsule (100 mg total) by mouth 2 (two) times daily.     donepezil 10 MG tablet  Commonly known as:  ARICEPT  Take 10 mg by mouth at bedtime.     enoxaparin 40 MG/0.4ML injection  Commonly known as:  LOVENOX  Inject 0.4 mLs (40 mg total) into the skin daily.     ferrous sulfate 325 (65 FE) MG tablet  Take 1  tablet (325 mg total) by mouth daily with breakfast.     HYDROcodone-acetaminophen 5-325 MG per tablet  Commonly known as:  NORCO  Take 1 tablet by mouth every 6 (six) hours as needed for moderate pain.     levothyroxine 50 MCG tablet  Commonly known as:  SYNTHROID, LEVOTHROID  Take 50 mcg by mouth daily before breakfast.     magnesium hydroxide 400 MG/5ML suspension  Commonly known as:  MILK OF MAGNESIA  Take 30 mLs by mouth daily as needed for mild constipation.     pravastatin 40 MG tablet  Commonly known as:  PRAVACHOL  Take 40 mg by mouth every evening.     QUEtiapine 25 MG tablet  Commonly known as:   SEROQUEL  Take 50 mg by mouth at bedtime.     Vitamin D (Ergocalciferol) 50000 UNITS Caps capsule  Commonly known as:  DRISDOL  Take 50,000 Units by mouth every 7 (seven) days. Pt takes on Sunday.         Physical Exam: Filed Vitals:   12/23/14 2038  BP: 124/70  Pulse: 60  Temp: 97.6 F (36.4 C)  Resp: 18  SpO2: 92%    General- elderly male, frail, in no acute distress Head- normocephalic, atraumatic Nose- normal nasal mucosa, no maxillary or frontal sinus tenderness, no nasal discharge Throat- moist mucus membrane, poor dentition Eyes- no pallor, no icterus, no discharge, normal conjunctiva, normal sclera Neck- no cervical lymphadenopathy Cardiovascular- normal s1,s2, no murmurs, trace left leg edema Respiratory- bilateral clear to auscultation, no wheeze, no rhonchi, no crackles, no use of accessory muscles Abdomen- bowel sounds present, soft, non tender Musculoskeletal- able to move all 4 extremities, left leg ROM limited  Neurological- no focal deficit, alert and oriented to person, place and time Skin- warm and dry, 3 surgical incision with some mild erythema of the mid incision, staples in place Psychiatry- normal mood and affect    Labs reviewed: Basic Metabolic Panel:  Recent Labs  12/18/14 0708 12/19/14 0716 12/20/14 0428  NA 137 136 139  K 3.9 4.0 3.7  CL 105 103 106  CO2 24 26 26   GLUCOSE 111* 131* 117*  BUN 19 14 16   CREATININE 1.04 1.15 1.18  CALCIUM 8.5* 8.4* 8.5*   Liver Function Tests:  Recent Labs  08/30/14 1002 12/17/14 2319  AST 44*  --   ALT 29  --   ALKPHOS 56  --   BILITOT 0.8  --   PROT 6.7  --   ALBUMIN 3.6 3.7   No results for input(s): LIPASE, AMYLASE in the last 8760 hours. No results for input(s): AMMONIA in the last 8760 hours. CBC:  Recent Labs  12/17/14 2319  12/19/14 0716 12/20/14 0428 12/21/14 0429  WBC 13.1*  < > 11.6* 10.5 8.5  NEUTROABS 11.3*  --  9.5* 8.4*  --   HGB 12.1*  < > 9.9* 9.5* 8.9*  HCT  37.2*  < > 30.0* 28.4* 26.5*  MCV 96.7  < > 96.3 96.1 96.0  PLT 182  < > 132* 131* 142*  < > = values in this interval not displayed. Cardiac Enzymes:  Recent Labs  08/30/14 1002  TROPONINI <0.03   BNP: Invalid input(s): POCBNP CBG:  Recent Labs  12/18/14 0728  GLUCAP 112*    Radiological Exams: Dg Chest 1 View  12/17/2014   CLINICAL DATA:  Fall today with LEFT hip pain.  LEFT hip fracture.  EXAM: CHEST  1 VIEW  COMPARISON:  Radiograph 07/23/2013  FINDINGS: Normal cardiac silhouette with ectatic aorta. No effusion, infiltrate, or pneumothorax.  IMPRESSION: No acute cardiopulmonary process.   Electronically Signed   By: Suzy Bouchard M.D.   On: 12/17/2014 17:06   Ct Head Wo Contrast  12/17/2014   CLINICAL DATA:  [place] and hit head today.  Headache.  EXAM: CT HEAD WITHOUT CONTRAST  TECHNIQUE: Contiguous axial images were obtained from the base of the skull through the vertex without intravenous contrast.  COMPARISON:  CT head 07/23/2013  FINDINGS: Mild to moderate generalized atrophy. Mild chronic microvascular ischemic change in the white matter  Negative for acute infarct.  Negative for hemorrhage or mass  Chronic depressed fracture right zygomatic arch is unchanged. No acute skull fracture.  IMPRESSION: No acute intracranial abnormality.   Electronically Signed   By: Franchot Gallo M.D.   On: 12/17/2014 17:25   Dg Hip Port Unilat With Pelvis 1v Left  12/18/2014   CLINICAL DATA:  Status post open reduction and internal fixation of left hip fracture.  EXAM: DG HIP (WITH OR WITHOUT PELVIS) 1V PORT LEFT  COMPARISON:  December 17, 2014.  FINDINGS: Status post surgical internal fixation of intertrochanteric fracture of proximal left femur. Good alignment of fracture components is noted. No dislocation is noted.  IMPRESSION: Status post surgical internal fixation of proximal left femoral fracture.   Electronically Signed   By: Marijo Conception, M.D.   On: 12/18/2014 14:06   Dg Hip Operative  Unilat With Pelvis Left  12/18/2014   CLINICAL DATA:  Fixation of a left intertrochanteric fracture. Intraoperative fluoroscopic spot films. Initial encounter.  EXAM: OPERATIVE LEFT HIP (WITH PELVIS IF PERFORMED) 2 VIEWS  TECHNIQUE: Fluoroscopic spot image(s) were submitted for interpretation post-operatively.  COMPARISON:  Plain films left hip 12/17/2014.  FINDINGS: Left hip screw and short intramedullary nail with a single distal interlocking screw is identified for fixation of an intertrochanteric fracture. Hardware is intact. Position and alignment are anatomic. No acute abnormality is identified.  IMPRESSION: ORIF left intertrochanteric fracture without evidence of complication.   Electronically Signed   By: Inge Rise M.D.   On: 12/18/2014 11:41   Dg Hip Unilat With Pelvis 2-3 Views Left  12/17/2014   CLINICAL DATA:  LEFT hip pain post fall  EXAM: DG HIP (WITH OR WITHOUT PELVIS) 2-3V LEFT  COMPARISON:  None  FINDINGS: Osseous demineralization.  Hip and SI joint spaces preserved and symmetric.  Minimally distracted intertrochanteric fracture LEFT femur.  No additional fracture, dislocation or bone destruction identified.  Degenerative disc disease changes at visualized lower lumbar spine.  IMPRESSION: Intertrochanteric fracture LEFT femur.   Electronically Signed   By: Lavonia Dana M.D.   On: 12/17/2014 17:06     Assessment/Plan  Unsteady gait Post fall with left hip fracture. S/p surgical repair. To work with therapy team for restoration of strength and function.   Left hip fracture S/p ORIF. Will have him work with physical therapy and occupational therapy team to help with gait training and muscle strengthening exercises.fall precautions. Skin care. Encourage to be out of bed. F/u with orthopedics. Continue lovenox for dvt prophylaxis. Continue norco 5-325 mg q6h prn pain. Continue vitamin d supplement  Blood loss anemia Post op from blood loss. Monitor h&h. Continue ferrous sulfate 325  mg daily  Dementia No behavioral disturbance. Continue aricept 10 mg daily and quetiapine  Depression Stable, continue citalopram 30 mg daily and seroquel 50 mg daily  Constipation Continue colace 100 mg bid and  prn dulcolax  Hypothyroidism Continue levothyroxine 50 mcg daily  Dizziness Denies dizziness but mentions "head feels cloudy". Check orthostatic vitals to rule out orthostasis  Goals of care: short term rehabilitation   Labs/tests ordered: cbc with diff, bmp  Family/ staff Communication: reviewed care plan with patient and nursing supervisor    Blanchie Serve, MD  [place] (Monday-Friday 8 am - 5 pm) [phone] (afterhours)

## 2015-01-12 ENCOUNTER — Non-Acute Institutional Stay (SKILLED_NURSING_FACILITY): Payer: Medicare Other | Admitting: Nurse Practitioner

## 2015-01-12 DIAGNOSIS — R2681 Unsteadiness on feet: Secondary | ICD-10-CM | POA: Diagnosis not present

## 2015-01-12 DIAGNOSIS — K5900 Constipation, unspecified: Secondary | ICD-10-CM

## 2015-01-12 DIAGNOSIS — F0390 Unspecified dementia without behavioral disturbance: Secondary | ICD-10-CM | POA: Diagnosis not present

## 2015-01-12 DIAGNOSIS — E538 Deficiency of other specified B group vitamins: Secondary | ICD-10-CM | POA: Diagnosis not present

## 2015-01-12 DIAGNOSIS — D62 Acute posthemorrhagic anemia: Secondary | ICD-10-CM | POA: Diagnosis not present

## 2015-01-12 DIAGNOSIS — S72142S Displaced intertrochanteric fracture of left femur, sequela: Secondary | ICD-10-CM

## 2015-01-12 NOTE — Progress Notes (Signed)
Patient ID: Barry Coffey, male   DOB: [birth year], 81 y.o.   MRN: [id number]    Nursing Home Location:  [place] of Service: SNF (31)  PCP: Tracie Harrier, MD  No Known Allergies  Chief Complaint  Patient presents with  . Medical Management of Chronic Issues    HPI:  Patient is a 81 y.o. male seen today at [facility] for discharge home. Pt at [facility] for short term rehabilitation post hospital admission from 12/17/14-12/21/14 post fall with left intertrochanteric hip fracture. He underwent open reduction and internal fixation. His pain is under control with current regimen. Recent lab work reveals B12 deficiency. Patient currently doing well with therapy, now stable to discharge home with home health.  Review of Systems:  Review of Systems  Constitutional: Negative for activity change, appetite change, fatigue and unexpected weight change.  HENT: Negative for congestion and hearing loss.   Eyes: Negative.   Respiratory: Negative for cough and shortness of breath.   Cardiovascular: Negative for chest pain, palpitations and leg swelling.  Gastrointestinal: Negative for abdominal pain, diarrhea and constipation.  Genitourinary: Negative for dysuria and difficulty urinating.  Musculoskeletal: Negative for myalgias and arthralgias.  Skin: Negative for color change and wound.  Neurological: Negative for dizziness and weakness.  Psychiatric/Behavioral: Negative for behavioral problems, confusion and agitation.       Short term memory loss noted    Past Medical History  Diagnosis Date  . Hyperlipidemia   . Dementia   . Depression   . Anxiety   . Bladder cancer 2011    s/p resection with mitomycin injection;Dr.Stoioff  . COPD (chronic obstructive pulmonary disease)    Past Surgical History  Procedure Laterality Date  . Back surgery      x4  . Cholecystectomy    . Foot surgery    . Femur im nail Left 12/18/2014   Procedure: INTRAMEDULLARY (IM) NAIL FEMORAL;  Surgeon: Thornton Park, MD;  Location: [facility] ORS;  Service: Orthopedics;  Laterality: Left;   Social History:   reports that he has quit smoking. His smoking use included Cigarettes. He has a 37.5 pack-year smoking history. He does not have any smokeless tobacco history on file. He reports that he does not drink alcohol or use illicit drugs.  Family History  Problem Relation Age of Onset  . Hypertension Father     Medications: Patient's Medications  New Prescriptions   No medications on file  Previous Medications   ALUM & MAG HYDROXIDE-SIMETH (MAALOX/MYLANTA) 200-200-20 MG/5ML SUSPENSION    Take 30 mLs by mouth every 4 (four) hours as needed for indigestion.   BISACODYL (DULCOLAX) 10 MG SUPPOSITORY    Place 1 suppository (10 mg total) rectally daily as needed for moderate constipation.   CITALOPRAM (CELEXA) 10 MG TABLET    Take 30 mg by mouth daily.   DOCUSATE SODIUM (COLACE) 100 MG CAPSULE    Take 1 capsule (100 mg total) by mouth 2 (two) times daily.   DONEPEZIL (ARICEPT) 10 MG TABLET    Take 10 mg by mouth at bedtime.   ENOXAPARIN (LOVENOX) 40 MG/0.4ML INJECTION    Inject 0.4 mLs (40 mg total) into the skin daily.   FERROUS SULFATE 325 (65 FE) MG TABLET    Take 1 tablet (325 mg total) by mouth daily with breakfast.   HYDROCODONE-ACETAMINOPHEN (NORCO) 5-325 MG PER TABLET    Take 1 tablet by mouth every 6 (six) hours as needed  for moderate pain.   LEVOTHYROXINE (SYNTHROID, LEVOTHROID) 50 MCG TABLET    Take 50 mcg by mouth daily before breakfast.    MAGNESIUM HYDROXIDE (MILK OF MAGNESIA) 400 MG/5ML SUSPENSION    Take 30 mLs by mouth daily as needed for mild constipation.   PRAVASTATIN (PRAVACHOL) 40 MG TABLET    Take 40 mg by mouth every evening.    QUETIAPINE (SEROQUEL) 25 MG TABLET    Take 50 mg by mouth at bedtime.   VITAMIN D, ERGOCALCIFEROL, (DRISDOL) 50000 UNITS CAPS CAPSULE    Take 50,000 Units by mouth every 7 (seven) days. Pt takes on  Sunday.  Modified Medications   No medications on file  Discontinued Medications   No medications on file     Physical Exam: Filed Vitals:   01/12/15 1607  BP: 106/64  Pulse: 55  Temp: 97.5 F (36.4 C)  Resp: 16    Physical Exam  Constitutional: He is oriented to person, place, and time. He appears well-developed and well-nourished. No distress.  HENT:  Head: Normocephalic and atraumatic.  Mouth/Throat: Oropharynx is clear and moist. No oropharyngeal exudate.  Eyes: Conjunctivae and EOM are normal. Pupils are equal, round, and reactive to light.  Neck: Normal range of motion. Neck supple.  Cardiovascular: Normal rate, regular rhythm and normal heart sounds.   Pulmonary/Chest: Effort normal and breath sounds normal.  Abdominal: Soft. Bowel sounds are normal.  Musculoskeletal: He exhibits no edema or tenderness.  Neurological: He is alert and oriented to person, place, and time.  Skin: Skin is warm and dry. He is not diaphoretic.  Well healed surgical incision   Psychiatric: He has a normal mood and affect.    Labs reviewed: Basic Metabolic Panel:  Recent Labs  12/18/14 0708 12/19/14 0716 12/20/14 0428  NA 137 136 139  K 3.9 4.0 3.7  CL 105 103 106  CO2 24 26 26   GLUCOSE 111* 131* 117*  BUN 19 14 16   CREATININE 1.04 1.15 1.18  CALCIUM 8.5* 8.4* 8.5*   Liver Function Tests:  Recent Labs  08/30/14 1002 12/17/14 2319  AST 44*  --   ALT 29  --   ALKPHOS 56  --   BILITOT 0.8  --   PROT 6.7  --   ALBUMIN 3.6 3.7   No results for input(s): LIPASE, AMYLASE in the last 8760 hours. No results for input(s): AMMONIA in the last 8760 hours. CBC:  Recent Labs  12/17/14 2319  12/19/14 0716 12/20/14 0428 12/21/14 0429  WBC 13.1*  < > 11.6* 10.5 8.5  NEUTROABS 11.3*  --  9.5* 8.4*  --   HGB 12.1*  < > 9.9* 9.5* 8.9*  HCT 37.2*  < > 30.0* 28.4* 26.5*  MCV 96.7  < > 96.3 96.1 96.0  PLT 182  < > 132* 131* 142*  < > = values in this interval not  displayed. TSH: No results for input(s): TSH in the last 8760 hours. A1C: Lab Results  Component Value Date   HGBA1C 6.2 07/25/2013   Lipid Panel: No results for input(s): CHOL, HDL, LDLCALC, TRIG, CHOLHDL, LDLDIRECT in the last 8760 hours.  TEST RESULT REF RANGE UNIT CBC w/Diff & PLT Final 12/24/2014 1:31PM TEST RESULT REF RANGE UNIT White Blood Cell (WBC) 7.4 3.8-10.8 k/uL Red Blood Cell (RBC) L 2.8 4.3-6.0 M/uL Hemoglobin (HGB) L 8.9 13.0-18.0 g/dL Hematocrit (HCT) L 28.6 39.0-54.0 % Mean Corpuscular Volume (MCV) H 102.9 79.0-100.0 fL Mean Corpuscular HGB (MCH) 32.0 26.8-33.2 pg Mean  Corpuscular HGB Conc (MCHC) 31.1 30.5-36.0 g/dL RBC Dist Width (RDW) 13.9 9.0-15.0 % Platelet 221 150-450 k/uL Neutrophils% 73.4 50.0-80.0 % Lymphocytes% L 10.4 21.0-51.0 % Monocytes% 11.0 2.0-12.0 % Eosinophils% 2.0 0.0-5.0 % Basophils% 0.5 0.0-2.0 % Neutrophils# 5.4 1.5-6.6 k/uL Lymphocytes # L 0.8 1.5-3.5 k/uL Monocytes # 0.8 0.1-1.0 k/uL Eosinophils# 0.2 0.0-0.6 k/uL Basophils# 0.0 0.0-0.1  SODIUM 141 135-146 mmol/L POTASSIUM 3.8 3.5-5.3 mmol/L CHLORIDE 107 95-109 mmol/L CO2 26.0 21.0-31.0 mmol/L Anion Gap 8 5-21 meq/L GLUCOSE 91 70-105 mg/dL UREA NITROGEN (BUN) 21 5-25 mg/dL Creatinine, Serum 0.75 0.60-1.30 mg/dL BUN/CREATININE RATIO H 28.0 6.0-25.0 Ratio CALCIUM L 8.0 8.2-10.5 mg/dL Glomerular Filtration Rate > 60 > 60 mls/min/1.73 m2 Glomerular Filtration Rate - African American > 60 > 60 GGY/IRS/8.54 m2 Folic Acid (Folate) Final 12/30/2014 4:27PM RESULT REF RANGE UNIT 9.45 5.38-24.00 ng/mL Vitamin B12 Final 12/30/2014 4:27PM RESULT REF RANGE UNIT L 53 211-911 pg/mL   Radiological Exams: Dg Chest 1 View  12/17/2014   CLINICAL DATA:  Fall today with LEFT hip pain.  LEFT hip fracture.  EXAM: CHEST  1 VIEW  COMPARISON:  Radiograph 07/23/2013  FINDINGS: Normal cardiac silhouette with ectatic aorta. No effusion, infiltrate, or pneumothorax.  IMPRESSION: No acute  cardiopulmonary process.   Electronically Signed   By: Suzy Bouchard M.D.   On: 12/17/2014 17:06   Ct Head Wo Contrast  12/17/2014   CLINICAL DATA:  [place] and hit head today.  Headache.  EXAM: CT HEAD WITHOUT CONTRAST  TECHNIQUE: Contiguous axial images were obtained from the base of the skull through the vertex without intravenous contrast.  COMPARISON:  CT head 07/23/2013  FINDINGS: Mild to moderate generalized atrophy. Mild chronic microvascular ischemic change in the white matter  Negative for acute infarct.  Negative for hemorrhage or mass  Chronic depressed fracture right zygomatic arch is unchanged. No acute skull fracture.  IMPRESSION: No acute intracranial abnormality.   Electronically Signed   By: Franchot Gallo M.D.   On: 12/17/2014 17:25   Dg Hip Port Unilat With Pelvis 1v Left  12/18/2014   CLINICAL DATA:  Status post open reduction and internal fixation of left hip fracture.  EXAM: DG HIP (WITH OR WITHOUT PELVIS) 1V PORT LEFT  COMPARISON:  December 17, 2014.  FINDINGS: Status post surgical internal fixation of intertrochanteric fracture of proximal left femur. Good alignment of fracture components is noted. No dislocation is noted.  IMPRESSION: Status post surgical internal fixation of proximal left femoral fracture.   Electronically Signed   By: Marijo Conception, M.D.   On: 12/18/2014 14:06   Dg Hip Operative Unilat With Pelvis Left  12/18/2014   CLINICAL DATA:  Fixation of a left intertrochanteric fracture. Intraoperative fluoroscopic spot films. Initial encounter.  EXAM: OPERATIVE LEFT HIP (WITH PELVIS IF PERFORMED) 2 VIEWS  TECHNIQUE: Fluoroscopic spot image(s) were submitted for interpretation post-operatively.  COMPARISON:  Plain films left hip 12/17/2014.  FINDINGS: Left hip screw and short intramedullary nail with a single distal interlocking screw is identified for fixation of an intertrochanteric fracture. Hardware is intact. Position and alignment are anatomic. No acute abnormality  is identified.  IMPRESSION: ORIF left intertrochanteric fracture without evidence of complication.   Electronically Signed   By: Inge Rise M.D.   On: 12/18/2014 11:41   Dg Hip Unilat With Pelvis 2-3 Views Left  12/17/2014   CLINICAL DATA:  LEFT hip pain post fall  EXAM: DG HIP (WITH OR WITHOUT PELVIS) 2-3V LEFT  COMPARISON:  None  FINDINGS: Osseous demineralization.  Hip and SI joint spaces preserved and symmetric.  Minimally distracted intertrochanteric fracture LEFT femur.  No additional fracture, dislocation or bone destruction identified.  Degenerative disc disease changes at visualized lower lumbar spine.  IMPRESSION: Intertrochanteric fracture LEFT femur.   Electronically Signed   By: Lavonia Dana M.D.   On: 12/17/2014 17:06    Assessment/Plan 1. Intertrochanteric fracture of left hip, sequela S/p ORIF. Continue lovenox for dvt prophylaxis. Pain controlled with current regimen Continue norco 5-325 mg q6h prn pain. Continue vitamin d supplement Ongoing ortho follow up  2. Acute blood loss anemia Post op blood loss, hgb remains stable at 8.9, will need ongoing follow up on cbc -cont ferrous sulfate 325 mg daily  3. B12 deficiency Noted on recent lab b12 low at 53, will start B12 injections at this time B12 1000 mcg [place]/im daily for 7 days then weekly for 4 weeks then monthly -nursing to educate on injections and will have home health for ongoing education and support for injections  4. Constipation, unspecified constipation type Stable on colace 100 mg BID and dulcolax PRN  5. Dementia without behavioral disturbance No noted behaviors at this time, conts on aricept and quetiapine.   6. Unsteady gait S/p fall with left hip fracture s/p repair. Working with therapy and had made progress. pt is stable for discharge-will need PT/OT/Nursing/HHA per home health. DME needed includes 3-1 and standard WC. Rx written.  will need to follow up with PCP within 2 weeks.     Carlos American.  Harle Battiest  [facility] [phone] 8 am - 5 pm) [phone] (after hours)

## 2015-02-20 ENCOUNTER — Other Ambulatory Visit: Payer: Self-pay | Admitting: Nurse Practitioner

## 2015-02-26 ENCOUNTER — Emergency Department: Payer: Medicare Other

## 2015-02-26 ENCOUNTER — Emergency Department
Admission: EM | Admit: 2015-02-26 | Discharge: 2015-02-26 | Disposition: A | Discharge status: Home / Self Care | Payer: Medicare Other | Attending: Emergency Medicine | Admitting: Emergency Medicine

## 2015-02-26 DIAGNOSIS — R410 Disorientation, unspecified: Secondary | ICD-10-CM

## 2015-02-26 DIAGNOSIS — Z792 Long term (current) use of antibiotics: Secondary | ICD-10-CM | POA: Diagnosis not present

## 2015-02-26 DIAGNOSIS — Z87891 Personal history of nicotine dependence: Secondary | ICD-10-CM | POA: Diagnosis not present

## 2015-02-26 DIAGNOSIS — Z79899 Other long term (current) drug therapy: Secondary | ICD-10-CM | POA: Diagnosis not present

## 2015-02-26 DIAGNOSIS — R001 Bradycardia, unspecified: Secondary | ICD-10-CM | POA: Diagnosis not present

## 2015-02-26 DIAGNOSIS — F0390 Unspecified dementia without behavioral disturbance: Secondary | ICD-10-CM

## 2015-02-26 DIAGNOSIS — R4182 Altered mental status, unspecified: Secondary | ICD-10-CM | POA: Diagnosis present

## 2015-02-26 LAB — CBC WITH DIFFERENTIAL/PLATELET
BASOS ABS: 0.1 10*3/uL (ref 0–0.1)
Basophils Relative: 1 %
EOS PCT: 1 %
Eosinophils Absolute: 0.1 10*3/uL (ref 0–0.7)
HCT: 36.2 % — ABNORMAL LOW (ref 40.0–52.0)
Hemoglobin: 11.7 g/dL — ABNORMAL LOW (ref 13.0–18.0)
LYMPHS PCT: 6 %
Lymphs Abs: 0.6 10*3/uL — ABNORMAL LOW (ref 1.0–3.6)
MCH: 30.3 pg (ref 26.0–34.0)
MCHC: 32.4 g/dL (ref 32.0–36.0)
MCV: 93.3 fL (ref 80.0–100.0)
MONO ABS: 0.8 10*3/uL (ref 0.2–1.0)
MONOS PCT: 9 %
Neutro Abs: 7.7 10*3/uL — ABNORMAL HIGH (ref 1.4–6.5)
Neutrophils Relative %: 83 %
PLATELETS: 320 10*3/uL (ref 150–440)
RBC: 3.88 MIL/uL — ABNORMAL LOW (ref 4.40–5.90)
RDW: 14.6 % — AB (ref 11.5–14.5)
WBC: 9.2 10*3/uL (ref 3.8–10.6)

## 2015-02-26 LAB — LIPASE, BLOOD: LIPASE: 25 U/L (ref 11–51)

## 2015-02-26 LAB — TROPONIN I: Troponin I: <0.03 ng/mL (ref <0.031)

## 2015-02-26 LAB — URINALYSIS COMPLETE WITH MICROSCOPIC (ARMC ONLY)
BACTERIA UA: NONE SEEN
BILIRUBIN URINE: NEGATIVE
Glucose, UA: NEGATIVE mg/dL
HGB URINE DIPSTICK: NEGATIVE
Ketones, ur: NEGATIVE mg/dL
LEUKOCYTES UA: NEGATIVE
Nitrite: NEGATIVE
PH: 6 (ref 5.0–8.0)
PROTEIN: NEGATIVE mg/dL
Specific Gravity, Urine: 1.018 (ref 1.005–1.030)

## 2015-02-26 LAB — COMPREHENSIVE METABOLIC PANEL
ALBUMIN: 2.9 g/dL — AB (ref 3.5–5.0)
ALT: 19 U/L (ref 17–63)
AST: 24 U/L (ref 15–41)
Alkaline Phosphatase: 117 U/L (ref 38–126)
Anion gap: 6 (ref 5–15)
BUN: 18 mg/dL (ref 6–20)
CHLORIDE: 106 mmol/L (ref 101–111)
CO2: 27 mmol/L (ref 22–32)
CREATININE: 0.83 mg/dL (ref 0.61–1.24)
Calcium: 8.6 mg/dL — ABNORMAL LOW (ref 8.9–10.3)
GFR calc Af Amer: >60 mL/min (ref >60)
GFR calc non Af Amer: >60 mL/min (ref >60)
GLUCOSE: 111 mg/dL — AB (ref 65–99)
POTASSIUM: 4.1 mmol/L (ref 3.5–5.1)
SODIUM: 139 mmol/L (ref 135–145)
Total Bilirubin: 0.4 mg/dL (ref 0.3–1.2)
Total Protein: 6.6 g/dL (ref 6.5–8.1)

## 2015-02-26 LAB — BRAIN NATRIURETIC PEPTIDE: B Natriuretic Peptide: 124 pg/mL — ABNORMAL HIGH (ref 0.0–100.0)

## 2015-02-26 NOTE — Discharge Instructions (Signed)
Confusion Confusion is the inability to think with your usual speed or clarity. Confusion may come on quickly or slowly over time. How quickly the confusion comes on depends on the cause. Confusion can be due to any number of causes. CAUSES   Concussion, head injury, or head trauma.  Seizures.  Stroke.  Fever.  Brain tumor.  Age related decreased brain function (dementia).  Heightened emotional states like rage or terror.  Mental illness in which the person loses the ability to determine what is real and what is not (hallucinations).  Infections such as a urinary tract infection (UTI).  Toxic effects from alcohol, drugs, or prescription medicines.  Dehydration and an imbalance of salts in the body (electrolytes).  Lack of sleep.  Low blood sugar (diabetes).  Low levels of oxygen from conditions such as chronic lung disorders.  Drug interactions or other medicine side effects.  Nutritional deficiencies, especially niacin, thiamine, vitamin C, or vitamin B.  Sudden drop in body temperature (hypothermia).  Change in routine, such as when traveling or hospitalized. SIGNS AND SYMPTOMS  People often describe their thinking as cloudy or unclear when they are confused. Confusion can also include feeling disoriented. That means you are unaware of where or who you are. You may also not know what the date or time is. If confused, you may also have difficulty paying attention, remembering, and making decisions. Some people also act aggressively when they are confused.  DIAGNOSIS  The medical evaluation of confusion may include:  Blood and urine tests.  X-rays.  Brain and nervous system tests.  Analyzing your brain waves (electroencephalogram or EEG).  Magnetic resonance imaging (MRI) of your head.  Computed tomography (CT) scan of your head.  Mental status tests in which your health care provider may ask many questions. Some of these questions may seem silly or strange,  but they are a very important test to help diagnose and treat confusion. TREATMENT  An admission to the hospital may not be needed, but a person with confusion should not be left alone. Stay with a family member or friend until the confusion clears. Avoid alcohol, pain relievers, or sedative drugs until you have fully recovered. Do not drive until directed by your health care provider. HOME CARE INSTRUCTIONS  What family and friends can do:  To find out if someone is confused, ask the person to state his or her name, age, and the date. If the person is unsure or answers incorrectly, he or she is confused.  Always introduce yourself, no matter how well the person knows you.  Often remind the person of his or her location.  Place a calendar and clock near the confused person.  Help the person with his or her medicines. You may want to use a pill box, an alarm as a reminder, or give the person each dose as prescribed.  Talk about current events and plans for the day.  Try to keep the environment calm, quiet, and peaceful.  Make sure the person keeps follow-up visits with his or her health care provider. PREVENTION  Ways to prevent confusion:  Avoid alcohol.  Eat a balanced diet.  Get enough sleep.  Take medicine only as directed by your health care provider.  Do not become isolated. Spend time with other people and make plans for your days.  Keep careful watch on your blood sugar levels if you are diabetic. SEEK IMMEDIATE MEDICAL CARE IF:   You develop severe headaches, repeated vomiting, seizures, blackouts, or   slurred speech.  There is increasing confusion, weakness, numbness, restlessness, or personality changes.  You develop a loss of balance, have marked dizziness, feel uncoordinated, or fall.  You have delusions, hallucinations, or develop severe anxiety.  Your family members think you need to be rechecked.   This information is not intended to replace advice given  to you by your health care provider. Make sure you discuss any questions you have with your health care provider.   Document Released: 05/19/2004 Document Revised: 05/02/2014 Document Reviewed: 05/17/2013 Elsevier Interactive Patient Education 2016 [employer] Inc[employer]  

## 2015-02-26 NOTE — ED Provider Notes (Signed)
[facility] Emergency Department Provider Note  ____________________________________________  Time seen: On arrival, via EMS  I have reviewed the triage vital signs and the nursing notes.   HISTORY  Chief Complaint Confusion   Patient has a history of dementia which limits history of present illness  HPIAlbert J Coffey is a 81 y.o. male who presents via EMS reportedly with worsening confusion. Patient reports he feels fine. Upon discussion with his daughter she reports that she feels his confusion has been worsening. Apparently he broke his hip a few months ago and has been declining since then. She felt that his confusion was worse this morning and is worried about a possible urinary tract infection or pneumonia. No neuro deficits are noted. No fevers or chills.       Past Medical History  Diagnosis Date  . Hyperlipidemia   . Dementia   . Depression   . Anxiety   . Bladder cancer ([place] II) 2011    s/p resection with mitomycin injection;Dr.Stoioff  . COPD (chronic obstructive pulmonary disease) [facility])     Patient Active Problem List   Diagnosis Date Noted  . Hip fracture ([place]) 12/17/2014  . Sinus bradycardia 01/04/2013  . Pre-syncope 04/10/2012    Past Surgical History  Procedure Laterality Date  . Back surgery      x4  . Cholecystectomy    . Foot surgery    . Femur im nail Left 12/18/2014    Procedure: INTRAMEDULLARY (IM) NAIL FEMORAL;  Surgeon: Thornton Park, MD;  Location: [facility] ORS;  Service: Orthopedics;  Laterality: Left;    Current Outpatient Rx  Name  Route  Sig  Dispense  Refill  . donepezil (ARICEPT) 10 MG tablet   Oral   Take 10 mg by mouth at bedtime.         Marland Kitchen doxycycline (VIBRAMYCIN) 100 MG capsule   Oral   Take 100 mg by mouth 2 (two) times daily.         . ferrous sulfate 325 (65 FE) MG tablet   Oral   Take 1 tablet (325 mg total) by mouth daily with breakfast.   30 tablet   3   . levothyroxine (SYNTHROID,  LEVOTHROID) 50 MCG tablet   Oral   Take 50 mcg by mouth daily before breakfast.          . pravastatin (PRAVACHOL) 40 MG tablet   Oral   Take 40 mg by mouth every evening.          Marland Kitchen QUEtiapine (SEROQUEL) 25 MG tablet   Oral   Take 50 mg by mouth at bedtime.         . Vitamin D, Ergocalciferol, (DRISDOL) 50000 UNITS CAPS capsule   Oral   Take 50,000 Units by mouth every 7 (seven) days. Pt takes on Sunday.           Allergies Review of patient's allergies indicates no known allergies.  Family History  Problem Relation Age of Onset  . Hypertension Father     Social History Social History  Substance Use Topics  . Smoking status: Former Smoker -- 1.50 packs/day for 25 years    Types: Cigarettes  . Smokeless tobacco: None  . Alcohol Use: No    Review of Systems  Constitutional: Negative for fever. Eyes: Negative for visual changes. ENT: Negative for sore throat Cardiovascular: Negative for chest pain. Respiratory: Negative for shortness of breath. Gastrointestinal: Negative for abdominal pain, vomiting and diarrhea. Genitourinary: Negative for dysuria. Musculoskeletal:  Negative for back pain. Skin: Negative for rash. Neurological: Negative for headaches or focal weakness Psychiatric: No anxiety    ____________________________________________   PHYSICAL EXAM:  VITAL SIGNS:  BP 132/69 mmHg  Pulse 54  Temp(Src) 97.7 F (36.5 C) (Oral)  Resp 20  Ht 5\' 10"  (1.778 m)  Wt 174 lb (78.926 kg)  BMI 24.97 kg/m2  SpO2 100%  ED Triage Vitals  Enc Vitals Group     BP --      Pulse --      Resp --      Temp --      Temp src --      SpO2 --      Weight --      Height --      Head Cir --      Peak Flow --      Pain Score --      Pain Loc --      Pain Edu? --      Excl. in [place]? --      Constitutional: Alert. Well appearing and in no distress. Eyes: Conjunctivae are normal.  ENT   Head: Normocephalic and atraumatic.   Mouth/Throat:  Mucous membranes are moist. Cardiovascular: Bradycardia, regular rhythm. Normal and symmetric distal pulses are present in all extremities. No murmurs, rubs, or gallops. Respiratory: Normal respiratory effort without tachypnea nor retractions. Breath sounds are clear and equal bilaterally.  Gastrointestinal: Soft and non-tender in all quadrants. No distention. There is no CVA tenderness. Genitourinary: deferred Musculoskeletal: Nontender with normal range of motion in all extremities. No lower extremity tenderness nor edema. Neurologic:  Normal speech and language. No gross focal neurologic deficits are appreciated. Skin:  Skin is warm, dry and intact. No rash noted. Psychiatric: Mood and affect are normal. Patient exhibits appropriate insight and judgment.  ____________________________________________    LABS (pertinent positives/negatives)  Labs Reviewed  COMPREHENSIVE METABOLIC PANEL - Abnormal; Notable for the following:    Glucose, Bld 111 (*)    Calcium 8.6 (*)    Albumin 2.9 (*)    All other components within normal limits  CBC WITH DIFFERENTIAL/PLATELET - Abnormal; Notable for the following:    RBC 3.88 (*)    Hemoglobin 11.7 (*)    HCT 36.2 (*)    RDW 14.6 (*)    Neutro Abs 7.7 (*)    Lymphs Abs 0.6 (*)    All other components within normal limits  BRAIN NATRIURETIC PEPTIDE - Abnormal; Notable for the following:    B Natriuretic Peptide 124.0 (*)    All other components within normal limits  URINALYSIS COMPLETEWITH MICROSCOPIC ([facility] ONLY) - Abnormal; Notable for the following:    Color, Urine YELLOW (*)    APPearance CLEAR (*)    Squamous Epithelial / LPF 0-5 (*)    All other components within normal limits  TROPONIN I  LIPASE, BLOOD    ____________________________________________   EKG  ED ECG REPORT I, Lavonia Drafts, the attending physician, personally viewed and interpreted this ECG.  Date: 02/26/2015 EKG Time: 9:57 AM Rate: 52 Rhythm: Sinus  bradycardia QRS Axis: normal Intervals: normal ST/T Wave abnormalities: normal Conduction Disutrbances: none Narrative Interpretation: unremarkable    ____________________________________________    RADIOLOGY I have personally reviewed any xrays that were ordered on this patient: Pulmonary vascular congestion, no edema  ____________________________________________   PROCEDURES  Procedure(s) performed: none  Critical Care performed: none  ____________________________________________   INITIAL IMPRESSION / ASSESSMENT AND PLAN / ED COURSE  Pertinent labs & imaging results that were available during my care of the patient were reviewed by me and considered in my medical decision making (see chart for details).  Patient overall is well-appearing. Afebrile, normal vital signs. Pleasant and interactive. No evidence of infection or abnormalities on imaging or labs. Discussed with daughter who is reassured. She will follow with PCP. She is to return if any change or abnormalities  ____________________________________________   FINAL CLINICAL IMPRESSION(S) / ED DIAGNOSES  Final diagnoses:  Confusion  Dementia, without behavioral disturbance     Lavonia Drafts, MD 02/26/15 1513

## 2015-02-26 NOTE — ED Notes (Signed)
Patient in via EMS from home for altered mental status.  VSS.  Patient does not appear in any acute distress.

## 2015-03-05 ENCOUNTER — Encounter: Payer: Self-pay | Admitting: Urology

## 2015-03-05 ENCOUNTER — Ambulatory Visit (INDEPENDENT_AMBULATORY_CARE_PROVIDER_SITE_OTHER): Payer: Medicare Other | Admitting: Urology

## 2015-03-05 VITALS — BP 88/56 | HR 81 | Ht 66.0 in | Wt 158.7 lb

## 2015-03-05 DIAGNOSIS — Q6102 Congenital multiple renal cysts: Secondary | ICD-10-CM

## 2015-03-05 DIAGNOSIS — R3129 Other microscopic hematuria: Secondary | ICD-10-CM | POA: Diagnosis not present

## 2015-03-05 DIAGNOSIS — Z8551 Personal history of malignant neoplasm of bladder: Secondary | ICD-10-CM | POA: Diagnosis not present

## 2015-03-05 DIAGNOSIS — N281 Cyst of kidney, acquired: Secondary | ICD-10-CM

## 2015-03-05 DIAGNOSIS — R351 Nocturia: Secondary | ICD-10-CM

## 2015-03-05 NOTE — Progress Notes (Signed)
03/05/2015 11:44 AM   Barry Coffey [birth year] [id number]  Referring provider: Tracie Harrier, MD [address] [facility] [place],  [address]  Chief Complaint  Patient presents with  . Hematuria    referred by [facility] old patient of Stoioff    HPI: Patient is an 81 year old white male with presents today with his daughter-in-law after his PCP, Dr. Ginette Pitman, found microscopic hematuria on an exam (10-50 RBC's/hpf on 02/24/2015) and urine culture was positive for E. Coli.  The patient was not having any urinary symptoms at the time of the UTI and he denies any symptoms at this time.    The patient had been seeing Dr. Bernardo Heater in the past and the daughter-in-law states that the patient has not been forthright concerning his medical care.  He is now suffering with dementia and his family can now take a more active role in his health care.  When I reviewed the records from Dr. 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  He underwent his last surveillance cystoscopy on 10/07/2013 and no reoccurrence was found.  He was on a semiannual schedule, but he did not return as scheduled.  He has not seen any other urologist.  He also has a history a bilateral renal cysts.    Patient's daughter-in-law became really tearful upon discovering this news and had to leave the room to compose herself.  The patient is a poor historian. He states he is not having any gross hematuria, dysuria or suprapubic pain.  The daughter-in-law did states his depends is soaked in the mornings upon wakening.  The patient lives with her and her husband.  His UA is unremarkable today.   PMH: Past Medical History  Diagnosis Date  . Hyperlipidemia   . Dementia   . Depression   . Anxiety   . Bladder cancer ([place]) 2011    s/p resection with mitomycin injection;Dr.Stoioff  . COPD (chronic obstructive pulmonary  disease) ([place])   . Diabetes [facility])     Surgical History: Past Surgical History  Procedure Laterality Date  . Back surgery      x4  . Cholecystectomy    . Foot surgery    . Femur im nail Left 12/18/2014    Procedure: INTRAMEDULLARY (IM) NAIL FEMORAL;  Surgeon: Thornton Park, MD;  Location: [facility] ORS;  Service: Orthopedics;  Laterality: Left;    Home Medications:    Medication List       This list is accurate as of: 03/05/15 11:59 PM.  Always use your most recent med list.               ALEVE 220 MG Caps  Generic drug:  Naproxen Sodium  Take by mouth.     aluminum-magnesium hydroxide-simethicone [id number] MG/5ML Susp  Commonly known as:  MAALOX  Take by mouth.     atorvastatin 10 MG tablet  Commonly known as:  LIPITOR  Take 10 mg by mouth.     citalopram 10 MG tablet  Commonly known as:  CELEXA  Take by mouth.     D 5000 5000 UNITS Tabs  Generic drug:  Cholecalciferol  Take by mouth.     docusate sodium 100 MG capsule  Commonly known as:  COLACE  Take 100 mg by mouth.     donepezil 10 MG tablet  Commonly known as:  ARICEPT  Take 10 mg by mouth at  bedtime.     doxycycline 100 MG capsule  Commonly known as:  VIBRAMYCIN  Take 100 mg by mouth 2 (two) times daily.     DULoxetine 30 MG capsule  Commonly known as:  CYMBALTA  Take 30 mg by mouth.     enoxaparin 40 MG/0.4ML injection  Commonly known as:  LOVENOX  Inject into the skin.     ferrous sulfate 325 (65 FE) MG tablet  Take 1 tablet (325 mg total) by mouth daily with breakfast.     ipratropium 0.06 % nasal spray  Commonly known as:  ATROVENT  Place into the nose.     levothyroxine 50 MCG tablet  Commonly known as:  SYNTHROID, LEVOTHROID  Take 50 mcg by mouth daily before breakfast.     magnesium hydroxide 400 MG/5ML suspension  Commonly known as:  MILK OF MAGNESIA  Take by mouth.     NORCO 5-325 MG tablet  Generic drug:  HYDROcodone-acetaminophen  Take by mouth.     PARoxetine 20 MG  tablet  Commonly known as:  PAXIL  Take 20 mg by mouth.     potassium chloride 10 MEQ tablet  Commonly known as:  K-DUR  Take by mouth.     potassium chloride 10 MEQ tablet  Commonly known as:  K-DUR,KLOR-CON  Take by mouth.     pravastatin 40 MG tablet  Commonly known as:  PRAVACHOL  Take 40 mg by mouth every evening.     QUEtiapine 25 MG tablet  Commonly known as:  SEROQUEL  Take 50 mg by mouth at bedtime.     Vitamin D (Ergocalciferol) 50000 UNITS Caps capsule  Commonly known as:  DRISDOL  Take 50,000 Units by mouth every 7 (seven) days. Pt takes on Sunday.        Allergies: No Known Allergies  Family History: Family History  Problem Relation Age of Onset  . Hypertension Father   . Kidney failure Father   . Prostate cancer Brother     Social History:  reports that he has quit smoking. His smoking use included Cigarettes. He has a 37.5 pack-year smoking history. He does not have any smokeless tobacco history on file. He reports that he does not drink alcohol or use illicit drugs.  ROS: UROLOGY Frequent Urination?: No Hard to postpone urination?: No Burning/pain with urination?: No Get up at night to urinate?: No Leakage of urine?: No Urine stream starts and stops?: No Trouble starting stream?: No Do you have to strain to urinate?: No Blood in urine?: Yes Urinary tract infection?: No Sexually transmitted disease?: No Injury to kidneys or bladder?: No Painful intercourse?: No Weak stream?: No Erection problems?: No Penile pain?: No  Gastrointestinal Nausea?: No Vomiting?: No Indigestion/heartburn?: No Diarrhea?: No Constipation?: No  Constitutional Fever: No Night sweats?: No Weight loss?: No Fatigue?: No  Skin Skin rash/lesions?: Yes Itching?: No  Eyes Blurred vision?: No Double vision?: No  Ears/Nose/Throat Sore throat?: No Sinus problems?: No  Hematologic/Lymphatic Swollen glands?: No Easy bruising?: Yes  Cardiovascular Leg  swelling?: No Chest pain?: No  Respiratory Cough?: No Shortness of breath?: No  Endocrine Excessive thirst?: No  Musculoskeletal Back pain?: No Joint pain?: No  Neurological Headaches?: No Dizziness?: No  Psychologic Depression?: No Anxiety?: No  Physical Exam: BP 88/56 mmHg  Pulse 81  Ht 5\' 6"  (1.676 m)  Wt 158 lb 11.2 oz (71.986 kg)  BMI 25.63 kg/m2  Constitutional: Well nourished. Alert and oriented, No acute distress. HEENT: [place] AT, moist  mucus membranes. Trachea midline, no masses. Cardiovascular: No clubbing, cyanosis, or edema. Respiratory: Normal respiratory effort, no increased work of breathing. GI: Abdomen is soft, non tender, non distended, no abdominal masses. Liver and spleen not palpable.  No hernias appreciated.  Stool sample for occult testing is not indicated.   GU: No CVA tenderness.  No bladder fullness or masses.  Patient with uncircumcised phallus. Foreskin easily retracted  Urethral meatus is patent.  No penile discharge. No penile lesions or rashes. Scrotum without lesions, cysts, rashes and/or edema.  Testicles are located scrotally bilaterally. No masses are appreciated in the testicles. Left and right epididymis are normal. Rectal: Patient with  normal sphincter tone. Anus and perineum without scarring or rashes. No rectal masses are appreciated. Prostate is approximately 60 grams, flat, no nodules are appreciated. Seminal vesicles are normal. Skin: No rashes, bruises or suspicious lesions. Lymph: No cervical or inguinal adenopathy. Neurologic: Grossly intact, no focal deficits, moving all 4 extremities. Psychiatric: Normal mood and affect.  Laboratory Data: Lab Results  Component Value Date   WBC 9.2 02/26/2015   HGB 11.7* 02/26/2015   HCT 36.2* 02/26/2015   MCV 93.3 02/26/2015   PLT 320 02/26/2015    Lab Results  Component Value Date   CREATININE 0.83 02/26/2015    Lab Results  Component Value Date   HGBA1C 6.2 07/25/2013     Urinalysis Results for orders placed or performed in visit on 03/05/15  Microscopic Examination  Result Value Ref Range   WBC, UA 0-5 0 -  5 /hpf   RBC, UA None seen 0 -  2 /hpf   Epithelial Cells (non renal) 0-10 0 - 10 /hpf   Mucus, UA Present (A) Not Estab.   Bacteria, UA Few (A) None seen/Few  Urinalysis, Complete  Result Value Ref Range   Specific Gravity, UA 1.010 1.005 - 1.030   pH, UA 5.5 5.0 - 7.5   Color, UA Yellow Yellow   Appearance Ur Clear Clear   Leukocytes, UA Negative Negative   Protein, UA Trace (A) Negative/Trace   Glucose, UA Negative Negative   Ketones, UA Negative Negative   RBC, UA Negative Negative   Bilirubin, UA Negative Negative   Urobilinogen, Ur 0.2 0.2 - 1.0 mg/dL   Nitrite, UA Negative Negative   Microscopic Examination See below:     Assessment & Plan:    1. Microscopic hematuria:   Patient had one incident of microscopic hematuria associated with an UTI.  He does not have microscopic hematuria at this visit.  I will not pursue a CT Urogram at this time because of his advanced age and dementia.  If the patient should develop gross hematuria, we will bring back the patient and his daughter-in-law for a discussion.    - Urinalysis, Complete  2. 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  I will schedule a surveillance cystoscopy.    3. Nocturia:   I explained to the patient and his daughter-in-law that nocturia is often multi-factorial and difficult to treat.  Sleeping disorders, heart conditions and peripheral vascular disease, diabetes,  enlarged prostate or urethral stricture causing bladder outlet obstruction and/or certain medications.  I have suggested that the patient avoid caffeine and alcohol in the evening. He may also benefit from fluid restrictions after 6:00 in the evening and voiding just prior to bedtime.  The patient may also benefit from a  discussion with his primary  care physician to see if he has risk factors for sleep apnea or other sleep disturbances and obtaining a sleep study.  4. Renal cysts:   I will obtain a RUS for an evaluation of his history of renal cysts.    Return for RUS report and cystoscopy.  Zara Council, [facility] [address] [place], [place] [address] [phone]

## 2015-03-06 DIAGNOSIS — N281 Cyst of kidney, acquired: Secondary | ICD-10-CM | POA: Insufficient documentation

## 2015-03-06 DIAGNOSIS — R3129 Other microscopic hematuria: Secondary | ICD-10-CM | POA: Insufficient documentation

## 2015-03-06 DIAGNOSIS — Z8551 Personal history of malignant neoplasm of bladder: Secondary | ICD-10-CM | POA: Insufficient documentation

## 2015-03-06 DIAGNOSIS — R351 Nocturia: Secondary | ICD-10-CM | POA: Insufficient documentation

## 2015-03-06 LAB — URINALYSIS, COMPLETE
BILIRUBIN UA: NEGATIVE
Glucose, UA: NEGATIVE
Ketones, UA: NEGATIVE
LEUKOCYTES UA: NEGATIVE
Nitrite, UA: NEGATIVE
PH UA: 5.5 (ref 5.0–7.5)
RBC UA: NEGATIVE
Specific Gravity, UA: 1.01 (ref 1.005–1.030)
Urobilinogen, Ur: 0.2 mg/dL (ref 0.2–1.0)

## 2015-03-06 LAB — MICROSCOPIC EXAMINATION: RBC, UA: NONE SEEN /hpf (ref 0–?)

## 2015-03-13 ENCOUNTER — Ambulatory Visit
Admission: RE | Admit: 2015-03-13 | Discharge: 2015-03-13 | Disposition: A | Discharge status: Home / Self Care | Payer: Medicare Other | Source: Ambulatory Visit | Attending: Urology | Admitting: Urology

## 2015-03-13 DIAGNOSIS — N281 Cyst of kidney, acquired: Secondary | ICD-10-CM | POA: Diagnosis not present

## 2015-03-13 DIAGNOSIS — R3129 Other microscopic hematuria: Secondary | ICD-10-CM

## 2015-03-23 ENCOUNTER — Encounter: Payer: Self-pay | Admitting: Emergency Medicine

## 2015-03-23 ENCOUNTER — Emergency Department: Payer: Medicare Other

## 2015-03-23 ENCOUNTER — Emergency Department
Admission: EM | Admit: 2015-03-23 | Discharge: 2015-03-23 | Disposition: A | Discharge status: Home / Self Care | Payer: Medicare Other | Attending: Emergency Medicine | Admitting: Emergency Medicine

## 2015-03-23 DIAGNOSIS — R5383 Other fatigue: Secondary | ICD-10-CM | POA: Diagnosis present

## 2015-03-23 DIAGNOSIS — F0390 Unspecified dementia without behavioral disturbance: Secondary | ICD-10-CM | POA: Insufficient documentation

## 2015-03-23 DIAGNOSIS — Z87891 Personal history of nicotine dependence: Secondary | ICD-10-CM | POA: Diagnosis not present

## 2015-03-23 DIAGNOSIS — Z79899 Other long term (current) drug therapy: Secondary | ICD-10-CM | POA: Diagnosis not present

## 2015-03-23 DIAGNOSIS — E119 Type 2 diabetes mellitus without complications: Secondary | ICD-10-CM | POA: Insufficient documentation

## 2015-03-23 DIAGNOSIS — R4182 Altered mental status, unspecified: Secondary | ICD-10-CM

## 2015-03-23 DIAGNOSIS — Z7901 Long term (current) use of anticoagulants: Secondary | ICD-10-CM | POA: Diagnosis not present

## 2015-03-23 LAB — CBC WITH DIFFERENTIAL/PLATELET
Basophils Absolute: 0.1 10*3/uL (ref 0–0.1)
Basophils Relative: 1 %
EOS PCT: 1 %
Eosinophils Absolute: 0.1 10*3/uL (ref 0–0.7)
HEMATOCRIT: 36 % — AB (ref 40.0–52.0)
Hemoglobin: 11.8 g/dL — ABNORMAL LOW (ref 13.0–18.0)
LYMPHS ABS: 0.9 10*3/uL — AB (ref 1.0–3.6)
LYMPHS PCT: 12 %
MCH: 30.2 pg (ref 26.0–34.0)
MCHC: 32.8 g/dL (ref 32.0–36.0)
MCV: 92.2 fL (ref 80.0–100.0)
Monocytes Absolute: 1 10*3/uL (ref 0.2–1.0)
Monocytes Relative: 14 %
NEUTROS ABS: 5.5 10*3/uL (ref 1.4–6.5)
Neutrophils Relative %: 72 %
PLATELETS: 264 10*3/uL (ref 150–440)
RBC: 3.91 MIL/uL — AB (ref 4.40–5.90)
RDW: 16.4 % — AB (ref 11.5–14.5)
WBC: 7.5 10*3/uL (ref 3.8–10.6)

## 2015-03-23 LAB — URINALYSIS COMPLETE WITH MICROSCOPIC (ARMC ONLY)
BACTERIA UA: NONE SEEN
Bilirubin Urine: NEGATIVE
GLUCOSE, UA: NEGATIVE mg/dL
HGB URINE DIPSTICK: NEGATIVE
Ketones, ur: NEGATIVE mg/dL
LEUKOCYTES UA: NEGATIVE
NITRITE: NEGATIVE
PH: 5 (ref 5.0–8.0)
Protein, ur: NEGATIVE mg/dL
SPECIFIC GRAVITY, URINE: 1.02 (ref 1.005–1.030)

## 2015-03-23 LAB — COMPREHENSIVE METABOLIC PANEL
ALBUMIN: 3.3 g/dL — AB (ref 3.5–5.0)
ALT: 24 U/L (ref 17–63)
AST: 42 U/L — AB (ref 15–41)
Alkaline Phosphatase: 128 U/L — ABNORMAL HIGH (ref 38–126)
Anion gap: 6 (ref 5–15)
BUN: 20 mg/dL (ref 6–20)
CHLORIDE: 107 mmol/L (ref 101–111)
CO2: 27 mmol/L (ref 22–32)
CREATININE: 0.9 mg/dL (ref 0.61–1.24)
Calcium: 8.7 mg/dL — ABNORMAL LOW (ref 8.9–10.3)
GFR calc Af Amer: >60 mL/min (ref >60)
GFR calc non Af Amer: >60 mL/min (ref >60)
GLUCOSE: 82 mg/dL (ref 65–99)
POTASSIUM: 4.1 mmol/L (ref 3.5–5.1)
SODIUM: 140 mmol/L (ref 135–145)
Total Bilirubin: 0.6 mg/dL (ref 0.3–1.2)
Total Protein: 7 g/dL (ref 6.5–8.1)

## 2015-03-23 LAB — TSH: TSH: 4.326 u[IU]/mL (ref 0.350–4.500)

## 2015-03-23 LAB — TROPONIN I

## 2015-03-23 MED ORDER — SODIUM CHLORIDE 0.9 % IV BOLUS (SEPSIS)
500.0000 mL | Freq: Once | INTRAVENOUS | Status: AC
  Administered 2015-03-23: 500 mL via INTRAVENOUS

## 2015-03-23 NOTE — ED Provider Notes (Signed)
[facility] Emergency Department Provider Note  ____________________________________________  Time seen: Seen upon arrival to the emergency department. Patient brought in by EMS.  I have reviewed the triage vital signs and the nursing notes.   HISTORY  Chief Complaint Fatigue  History this time was from EMS. The patient is confused and has dementia and was unable to give a detailed history.  HPI Barry Coffey is a 81 y.o. male with a history of dementia and diabetes who is presenting today with increased fatigue per his family. The patient denies any pain, shortness of breath.  Past Medical History  Diagnosis Date  . Hyperlipidemia   . Dementia   . Depression   . Anxiety   . Bladder cancer ([place]) 2011    s/p resection with mitomycin injection;Dr.Stoioff  . COPD (chronic obstructive pulmonary disease) ([place])   . Diabetes [facility])     Patient Active Problem List   Diagnosis Date Noted  . Microscopic hematuria 03/06/2015  . History of bladder cancer 03/06/2015  . Nocturia 03/06/2015  . Bilateral renal cysts 03/06/2015  . Hip fracture ([place]) 12/17/2014  . Sinus bradycardia 01/04/2013  . Pre-syncope 04/10/2012    Past Surgical History  Procedure Laterality Date  . Back surgery      x4  . Cholecystectomy    . Foot surgery    . Femur im nail Left 12/18/2014    Procedure: INTRAMEDULLARY (IM) NAIL FEMORAL;  Surgeon: Thornton Park, MD;  Location: [facility] ORS;  Service: Orthopedics;  Laterality: Left;    Current Outpatient Rx  Name  Route  Sig  Dispense  Refill  . aluminum-magnesium hydroxide-simethicone (MAALOX) [id number] MG/5ML SUSP   Oral   Take by mouth.         Marland Kitchen atorvastatin (LIPITOR) 10 MG tablet   Oral   Take 10 mg by mouth.         . Cholecalciferol (D 5000) 5000 UNITS TABS   Oral   Take by mouth.         . citalopram (CELEXA) 10 MG tablet   Oral   Take by mouth.         . docusate sodium (COLACE) 100 MG capsule    Oral   Take 100 mg by mouth.         . donepezil (ARICEPT) 10 MG tablet   Oral   Take 10 mg by mouth at bedtime.         Marland Kitchen doxycycline (VIBRAMYCIN) 100 MG capsule   Oral   Take 100 mg by mouth 2 (two) times daily.         . DULoxetine (CYMBALTA) 30 MG capsule   Oral   Take 30 mg by mouth.         . enoxaparin (LOVENOX) 40 MG/0.4ML injection   Subcutaneous   Inject into the skin.         . ferrous sulfate 325 (65 FE) MG tablet   Oral   Take 1 tablet (325 mg total) by mouth daily with breakfast.   30 tablet   3   . HYDROcodone-acetaminophen (NORCO) 5-325 MG tablet   Oral   Take by mouth.         Marland Kitchen ipratropium (ATROVENT) 0.06 % nasal spray   Nasal   Place into the nose.         . levothyroxine (SYNTHROID, LEVOTHROID) 50 MCG tablet   Oral   Take 50 mcg by mouth daily before breakfast.          .  magnesium hydroxide (MILK OF MAGNESIA) 400 MG/5ML suspension   Oral   Take by mouth.         . Naproxen Sodium (ALEVE) 220 MG CAPS   Oral   Take by mouth.         Marland Kitchen PARoxetine (PAXIL) 20 MG tablet   Oral   Take 20 mg by mouth.         . potassium chloride (K-DUR) 10 MEQ tablet   Oral   Take by mouth.         . potassium chloride (K-DUR,KLOR-CON) 10 MEQ tablet   Oral   Take by mouth.         . pravastatin (PRAVACHOL) 40 MG tablet   Oral   Take 40 mg by mouth every evening.          Marland Kitchen QUEtiapine (SEROQUEL) 25 MG tablet   Oral   Take 50 mg by mouth at bedtime.         . Vitamin D, Ergocalciferol, (DRISDOL) 50000 UNITS CAPS capsule   Oral   Take 50,000 Units by mouth every 7 (seven) days. Pt takes on Sunday.           Allergies Review of patient's allergies indicates no known allergies.  Family History  Problem Relation Age of Onset  . Hypertension Father   . Kidney failure Father   . Prostate cancer Brother     Social History Social History  Substance Use Topics  . Smoking status: Former Smoker -- 1.50 packs/day for  25 years    Types: Cigarettes  . Smokeless tobacco: None     Comment: quit 50 years  . Alcohol Use: No    Review of Systems Constitutional: No fever/chills Eyes: No visual changes. ENT: No sore throat. Cardiovascular: Denies chest pain. Respiratory: Denies shortness of breath. Gastrointestinal: No abdominal pain.  No nausea, no vomiting.  No diarrhea.  No constipation. Genitourinary: Negative for dysuria. Musculoskeletal: Negative for back pain. Skin: Negative for rash. Neurological: Negative for headaches, focal weakness or numbness.  10-point ROS otherwise negative. However, the history is limited by the patient's dementia.  ____________________________________________   PHYSICAL EXAM:  VITAL SIGNS: ED Triage Vitals  Enc Vitals Group     BP --      Pulse --      Resp --      Temp --      Temp src --      SpO2 --      Weight --      Height --      Head Cir --      Peak Flow --      Pain Score --      Pain Loc --      Pain Edu? --      Excl. in [place]? --     Constitutional: Alert and oriented to his name. Well appearing and in no acute distress. Eyes: Conjunctivae are normal. PERRL. EOMI. Head: Atraumatic. Nose: No congestion/rhinnorhea. Mouth/Throat: Mucous membranes are moist.   Neck: No stridor.   Cardiovascular: Normal rate, regular rhythm. Grossly normal heart sounds.  Good peripheral circulation. Respiratory: Normal respiratory effort.  No retractions. Lungs CTAB. Gastrointestinal: Soft and nontender. No distention. No abdominal bruits. No CVA tenderness. Musculoskeletal: No lower extremity tenderness nor edema.  No joint effusions. Neurologic:  Normal speech and language. No gross focal neurologic deficits are appreciated.  Skin:  Skin is warm, dry and intact. No rash noted. Psychiatric: Mood and  affect are normal. Speech and behavior are normal.  ____________________________________________   LABS (all labs ordered are listed, but only abnormal  results are displayed)  Labs Reviewed  CBC WITH DIFFERENTIAL/PLATELET - Abnormal; Notable for the following:    RBC 3.91 (*)    Hemoglobin 11.8 (*)    HCT 36.0 (*)    RDW 16.4 (*)    Lymphs Abs 0.9 (*)    All other components within normal limits  COMPREHENSIVE METABOLIC PANEL - Abnormal; Notable for the following:    Calcium 8.7 (*)    Albumin 3.3 (*)    AST 42 (*)    Alkaline Phosphatase 128 (*)    All other components within normal limits  TROPONIN I  TSH  URINALYSIS COMPLETEWITH MICROSCOPIC ([facility] ONLY)   ____________________________________________  EKG  ED ECG REPORT I, Doran Stabler, the attending physician, personally viewed and interpreted this ECG.   Date: 03/23/2015  EKG Time: 1343  Rate: 89  Rhythm: normal sinus rhythm  Axis: Normal axis  Intervals:none  ST&T Change: No obvious ST elevation or depression. No obvious T-wave inversion. Poor baseline likely related to patient tremor.  EKG machine read as junctional rhythm but this is likely as a result of the baseline. Appears to have P waves in V2 and V3.  ____________________________________________  RADIOLOGY  No acute finding on the chest x-ray or CAT scan of the brain. Pending pelvis x-ray. ____________________________________________   PROCEDURES   ____________________________________________   INITIAL IMPRESSION / ASSESSMENT AND PLAN / ED COURSE  Pertinent labs & imaging results that were available during my care of the patient were reviewed by me and considered in my medical decision making (see chart for details).  ----------------------------------------- 3:05 PM on 03/23/2015 -----------------------------------------  I called as well as left voice messages on all available numbers on the patient's demographic section of the medical record.  ----------------------------------------- 3:13 PM on 03/23/2015 -----------------------------------------  Daughter-in-law is now the  bedside and says that she is the patient's primary caregiver. She said that he was having a normal conversation and mental status 2 days ago and has since had several falls and now has been sleeping all day over the past 2 days. She said that he has had a slow decline over the last 3 months since breaking his hip.  However, over the past 2 days this has been a significantly change mental status. We discussed possible rehabilitation her SNF placement and the daughter-in-law says that the patient would not want that and that she would rather take him home. The daughter-in-law says that the patient is also had multiple falls over the past 2 days. The patient has not been complaining of any particular area of the body which was hurting him. The daughter-in-law says that he has had some witnesses as well as unwitnessed falls. She says that she has never seen him pass out and fall. She says that he has missed judged distances and tried to sit in chairs and then fell on his buttocks or hip.  ----------------------------------------- 3:41 PM on 03/23/2015 -----------------------------------------  Signed out to Dr. Mariea Clonts.  ____________________________________________   FINAL CLINICAL IMPRESSION(S) / ED DIAGNOSES  Altered mental status.    Orbie Pyo, MD 03/23/15 [phone]

## 2015-03-23 NOTE — ED Notes (Signed)
Pt from home via ems family states "he's more sleepy than normal. Pt states he is tired but no other complaints at this time

## 2015-03-23 NOTE — ED Provider Notes (Signed)
I took sign out from Dr. Dineen Kid who reported that this patient had had a fall and increasing fatigue. The remainder of his workup was reassuring to Dr. Dineen Kid and on my review, he does not have a urinary tract infection and the x-ray of his pelvis is negative for any acute process. I have relayed this information to both the patient and his daughter-in-law who is his primary caregiver. They have reiterated that their preference is to call home; they will have os follow-up with her primary care physician. They understand return precautions.  Eula Listen, MD 03/23/15 [phone]

## 2015-03-23 NOTE — Discharge Instructions (Signed)
Please make an appointment for close follow-up with your primary care physician.   Please return to the emergency department for worsening changes in mental status, fever, falls, chest pain, shortness of breath, inability to keep down fluids, or any other symptoms concerning to you.

## 2015-03-25 ENCOUNTER — Telehealth: Payer: Self-pay | Admitting: Urology

## 2015-03-25 NOTE — Telephone Encounter (Signed)
Patient's daughter called and cancelled his appointment. They have called in hospice, he has been asleep for 3 days. Don't expect him to wake up.  Thanks,  Sharyn Lull

## 2015-03-26 ENCOUNTER — Other Ambulatory Visit: Payer: Medicare Other | Admitting: Urology

## 2015-06-13 ENCOUNTER — Inpatient Hospital Stay
Admission: EM | Admit: 2015-06-13 | Discharge: 2015-06-17 | DRG: 684 | Disposition: A | Discharge status: Hospice | Attending: Internal Medicine | Admitting: Internal Medicine

## 2015-06-13 ENCOUNTER — Emergency Department

## 2015-06-13 DIAGNOSIS — L8990 Pressure ulcer of unspecified site, unspecified stage: Secondary | ICD-10-CM | POA: Insufficient documentation

## 2015-06-13 DIAGNOSIS — N170 Acute kidney failure with tubular necrosis: Principal | ICD-10-CM | POA: Diagnosis present

## 2015-06-13 DIAGNOSIS — E785 Hyperlipidemia, unspecified: Secondary | ICD-10-CM | POA: Diagnosis present

## 2015-06-13 DIAGNOSIS — E876 Hypokalemia: Secondary | ICD-10-CM | POA: Diagnosis present

## 2015-06-13 DIAGNOSIS — F419 Anxiety disorder, unspecified: Secondary | ICD-10-CM | POA: Diagnosis present

## 2015-06-13 DIAGNOSIS — I959 Hypotension, unspecified: Secondary | ICD-10-CM | POA: Diagnosis present

## 2015-06-13 DIAGNOSIS — F329 Major depressive disorder, single episode, unspecified: Secondary | ICD-10-CM | POA: Diagnosis present

## 2015-06-13 DIAGNOSIS — R627 Adult failure to thrive: Secondary | ICD-10-CM | POA: Diagnosis present

## 2015-06-13 DIAGNOSIS — Z87891 Personal history of nicotine dependence: Secondary | ICD-10-CM

## 2015-06-13 DIAGNOSIS — Z66 Do not resuscitate: Secondary | ICD-10-CM | POA: Diagnosis present

## 2015-06-13 DIAGNOSIS — E119 Type 2 diabetes mellitus without complications: Secondary | ICD-10-CM | POA: Diagnosis present

## 2015-06-13 DIAGNOSIS — Z23 Encounter for immunization: Secondary | ICD-10-CM

## 2015-06-13 DIAGNOSIS — R680 Hypothermia, not associated with low environmental temperature: Secondary | ICD-10-CM | POA: Diagnosis present

## 2015-06-13 DIAGNOSIS — L89151 Pressure ulcer of sacral region, stage 1: Secondary | ICD-10-CM | POA: Diagnosis present

## 2015-06-13 DIAGNOSIS — E860 Dehydration: Secondary | ICD-10-CM | POA: Diagnosis present

## 2015-06-13 DIAGNOSIS — Z515 Encounter for palliative care: Secondary | ICD-10-CM | POA: Diagnosis present

## 2015-06-13 DIAGNOSIS — Z8551 Personal history of malignant neoplasm of bladder: Secondary | ICD-10-CM

## 2015-06-13 DIAGNOSIS — F0390 Unspecified dementia without behavioral disturbance: Secondary | ICD-10-CM | POA: Diagnosis present

## 2015-06-13 DIAGNOSIS — J439 Emphysema, unspecified: Secondary | ICD-10-CM | POA: Diagnosis present

## 2015-06-13 DIAGNOSIS — E039 Hypothyroidism, unspecified: Secondary | ICD-10-CM | POA: Diagnosis present

## 2015-06-13 DIAGNOSIS — A419 Sepsis, unspecified organism: Secondary | ICD-10-CM | POA: Diagnosis present

## 2015-06-13 DIAGNOSIS — R4182 Altered mental status, unspecified: Secondary | ICD-10-CM | POA: Diagnosis present

## 2015-06-13 LAB — CBC WITH DIFFERENTIAL/PLATELET
BASOS PCT: 0 %
Basophils Absolute: 0 10*3/uL (ref 0–0.1)
EOS ABS: 0 10*3/uL (ref 0–0.7)
Eosinophils Relative: 0 %
HEMATOCRIT: 38.3 % — AB (ref 40.0–52.0)
HEMOGLOBIN: 12.5 g/dL — AB (ref 13.0–18.0)
LYMPHS ABS: 0.3 10*3/uL — AB (ref 1.0–3.6)
Lymphocytes Relative: 3 %
MCH: 31.7 pg (ref 26.0–34.0)
MCHC: 32.8 g/dL (ref 32.0–36.0)
MCV: 96.7 fL (ref 80.0–100.0)
MONOS PCT: 8 %
Monocytes Absolute: 0.9 10*3/uL (ref 0.2–1.0)
NEUTROS ABS: 9.5 10*3/uL — AB (ref 1.4–6.5)
NEUTROS PCT: 89 %
Platelets: 222 10*3/uL (ref 150–440)
RBC: 3.96 MIL/uL — AB (ref 4.40–5.90)
RDW: 15.3 % — ABNORMAL HIGH (ref 11.5–14.5)
WBC: 10.7 10*3/uL — AB (ref 3.8–10.6)

## 2015-06-13 LAB — COMPREHENSIVE METABOLIC PANEL
ALBUMIN: 3.1 g/dL — AB (ref 3.5–5.0)
ALK PHOS: 136 U/L — AB (ref 38–126)
ALT: 144 U/L — AB (ref 17–63)
ANION GAP: 9 (ref 5–15)
AST: 134 U/L — ABNORMAL HIGH (ref 15–41)
BUN: 26 mg/dL — ABNORMAL HIGH (ref 6–20)
CALCIUM: 8.3 mg/dL — AB (ref 8.9–10.3)
CHLORIDE: 110 mmol/L (ref 101–111)
CO2: 24 mmol/L (ref 22–32)
CREATININE: 1.2 mg/dL (ref 0.61–1.24)
GFR calc Af Amer: >60 mL/min (ref >60)
GFR calc non Af Amer: 53 mL/min — ABNORMAL LOW (ref >60)
GLUCOSE: 139 mg/dL — AB (ref 65–99)
Potassium: 3.7 mmol/L (ref 3.5–5.1)
SODIUM: 143 mmol/L (ref 135–145)
Total Bilirubin: 1.2 mg/dL (ref 0.3–1.2)
Total Protein: 6.2 g/dL — ABNORMAL LOW (ref 6.5–8.1)

## 2015-06-13 LAB — URINALYSIS COMPLETE WITH MICROSCOPIC (ARMC ONLY)
GLUCOSE, UA: NEGATIVE mg/dL
HGB URINE DIPSTICK: NEGATIVE
KETONES UR: NEGATIVE mg/dL
LEUKOCYTES UA: NEGATIVE
Nitrite: NEGATIVE
PH: 5 (ref 5.0–8.0)
Protein, ur: 100 mg/dL — AB
SPECIFIC GRAVITY, URINE: 1.027 (ref 1.005–1.030)

## 2015-06-13 LAB — LACTIC ACID, PLASMA: LACTIC ACID, VENOUS: 3 mmol/L — AB (ref 0.5–2.0)

## 2015-06-13 MED ORDER — DEXTROSE 5 % IV SOLN
1.0000 g | Freq: Once | INTRAVENOUS | Status: AC
  Administered 2015-06-14: 1 g via INTRAVENOUS
  Filled 2015-06-13: qty 10

## 2015-06-13 MED ORDER — SODIUM CHLORIDE 0.9 % IV BOLUS (SEPSIS)
1000.0000 mL | Freq: Once | INTRAVENOUS | Status: AC
  Administered 2015-06-13: 1000 mL via INTRAVENOUS

## 2015-06-13 NOTE — ED Notes (Signed)
Performed in and out cath with tech Sam, for urine specimen. Pt had redness at urinary meatus. Changed pt's incontinence brief. Pt had been incontinent of stool.

## 2015-06-13 NOTE — ED Notes (Signed)
MD Kinner at bedside  

## 2015-06-13 NOTE — ED Notes (Signed)
Pt's caretaker reports pt has diagnosed UTI. Pt is a hospice pt, however was doing much better - they were considering him for removal from hospice. Pt developed UTI (diagnosed today), and had prescription for antibiotics called in this morning. Pt has not begun antibiotics. PT became lethargic X 2 days, unable to use walker. Pt would not eat today.

## 2015-06-14 DIAGNOSIS — R627 Adult failure to thrive: Secondary | ICD-10-CM | POA: Diagnosis present

## 2015-06-14 DIAGNOSIS — R680 Hypothermia, not associated with low environmental temperature: Secondary | ICD-10-CM | POA: Diagnosis present

## 2015-06-14 DIAGNOSIS — R4182 Altered mental status, unspecified: Secondary | ICD-10-CM | POA: Diagnosis present

## 2015-06-14 DIAGNOSIS — A419 Sepsis, unspecified organism: Secondary | ICD-10-CM | POA: Diagnosis present

## 2015-06-14 DIAGNOSIS — F0390 Unspecified dementia without behavioral disturbance: Secondary | ICD-10-CM | POA: Diagnosis present

## 2015-06-14 DIAGNOSIS — L8990 Pressure ulcer of unspecified site, unspecified stage: Secondary | ICD-10-CM | POA: Insufficient documentation

## 2015-06-14 DIAGNOSIS — N170 Acute kidney failure with tubular necrosis: Secondary | ICD-10-CM | POA: Diagnosis present

## 2015-06-14 DIAGNOSIS — E785 Hyperlipidemia, unspecified: Secondary | ICD-10-CM | POA: Diagnosis present

## 2015-06-14 DIAGNOSIS — F329 Major depressive disorder, single episode, unspecified: Secondary | ICD-10-CM | POA: Diagnosis present

## 2015-06-14 DIAGNOSIS — E860 Dehydration: Secondary | ICD-10-CM | POA: Diagnosis present

## 2015-06-14 DIAGNOSIS — F419 Anxiety disorder, unspecified: Secondary | ICD-10-CM | POA: Diagnosis present

## 2015-06-14 DIAGNOSIS — Z8551 Personal history of malignant neoplasm of bladder: Secondary | ICD-10-CM | POA: Diagnosis not present

## 2015-06-14 DIAGNOSIS — E876 Hypokalemia: Secondary | ICD-10-CM | POA: Diagnosis present

## 2015-06-14 DIAGNOSIS — Z87891 Personal history of nicotine dependence: Secondary | ICD-10-CM | POA: Diagnosis not present

## 2015-06-14 DIAGNOSIS — E039 Hypothyroidism, unspecified: Secondary | ICD-10-CM | POA: Diagnosis present

## 2015-06-14 DIAGNOSIS — J439 Emphysema, unspecified: Secondary | ICD-10-CM | POA: Diagnosis present

## 2015-06-14 DIAGNOSIS — I959 Hypotension, unspecified: Secondary | ICD-10-CM | POA: Diagnosis present

## 2015-06-14 DIAGNOSIS — Z66 Do not resuscitate: Secondary | ICD-10-CM | POA: Diagnosis present

## 2015-06-14 DIAGNOSIS — L89151 Pressure ulcer of sacral region, stage 1: Secondary | ICD-10-CM | POA: Diagnosis present

## 2015-06-14 DIAGNOSIS — Z23 Encounter for immunization: Secondary | ICD-10-CM | POA: Diagnosis not present

## 2015-06-14 DIAGNOSIS — Z515 Encounter for palliative care: Secondary | ICD-10-CM | POA: Diagnosis present

## 2015-06-14 DIAGNOSIS — E119 Type 2 diabetes mellitus without complications: Secondary | ICD-10-CM | POA: Diagnosis present

## 2015-06-14 LAB — BASIC METABOLIC PANEL
Anion gap: 7 (ref 5–15)
BUN: 26 mg/dL — ABNORMAL HIGH (ref 6–20)
CALCIUM: 8.2 mg/dL — AB (ref 8.9–10.3)
CHLORIDE: 114 mmol/L — AB (ref 101–111)
CO2: 23 mmol/L (ref 22–32)
CREATININE: 1.03 mg/dL (ref 0.61–1.24)
GLUCOSE: 110 mg/dL — AB (ref 65–99)
Potassium: 3.3 mmol/L — ABNORMAL LOW (ref 3.5–5.1)
Sodium: 144 mmol/L (ref 135–145)

## 2015-06-14 LAB — LACTIC ACID, PLASMA: Lactic Acid, Venous: 1.2 mmol/L (ref 0.5–2.0)

## 2015-06-14 LAB — APTT: APTT: 36 s (ref 24–36)

## 2015-06-14 LAB — CBC
HEMATOCRIT: 34.1 % — AB (ref 40.0–52.0)
HEMOGLOBIN: 11.4 g/dL — AB (ref 13.0–18.0)
MCH: 32.4 pg (ref 26.0–34.0)
MCHC: 33.6 g/dL (ref 32.0–36.0)
MCV: 96.3 fL (ref 80.0–100.0)
Platelets: 184 10*3/uL (ref 150–440)
RBC: 3.54 MIL/uL — ABNORMAL LOW (ref 4.40–5.90)
RDW: 15.5 % — AB (ref 11.5–14.5)
WBC: 7 10*3/uL (ref 3.8–10.6)

## 2015-06-14 LAB — PROTIME-INR
INR: 1.26
Prothrombin Time: 15.9 seconds — ABNORMAL HIGH (ref 11.4–15.0)

## 2015-06-14 LAB — PROCALCITONIN: PROCALCITONIN: 2.68 ng/mL

## 2015-06-14 MED ORDER — VITAMIN D (ERGOCALCIFEROL) 1.25 MG (50000 UNIT) PO CAPS
50000.0000 [IU] | ORAL_CAPSULE | ORAL | Status: DC
  Administered 2015-06-15: 50000 [IU] via ORAL
  Filled 2015-06-14: qty 1

## 2015-06-14 MED ORDER — ONDANSETRON HCL 4 MG PO TABS: 4.0000 mg | ORAL_TABLET | Freq: Four times a day (QID) | ORAL | Status: DC | PRN

## 2015-06-14 MED ORDER — ONDANSETRON HCL 4 MG/2ML IJ SOLN: 4.0000 mg | Freq: Four times a day (QID) | INTRAMUSCULAR | Status: DC | PRN

## 2015-06-14 MED ORDER — LORAZEPAM 2 MG/ML IJ SOLN
2.0000 mg | Freq: Four times a day (QID) | INTRAMUSCULAR | Status: DC | PRN
  Administered 2015-06-17: 2 mg via INTRAVENOUS
  Filled 2015-06-14: qty 1

## 2015-06-14 MED ORDER — LEVOTHYROXINE SODIUM 50 MCG PO TABS
50.0000 ug | ORAL_TABLET | Freq: Every day | ORAL | Status: DC
  Administered 2015-06-14 – 2015-06-16 (×3): 50 ug via ORAL
  Filled 2015-06-14 (×3): qty 1

## 2015-06-14 MED ORDER — CITALOPRAM HYDROBROMIDE 20 MG PO TABS
10.0000 mg | ORAL_TABLET | Freq: Every day | ORAL | Status: DC
  Administered 2015-06-14 – 2015-06-15 (×2): 10 mg via ORAL
  Filled 2015-06-14 (×2): qty 1

## 2015-06-14 MED ORDER — PAROXETINE HCL 20 MG PO TABS
20.0000 mg | ORAL_TABLET | Freq: Every day | ORAL | Status: DC
  Administered 2015-06-15: 20 mg via ORAL
  Filled 2015-06-14: qty 1

## 2015-06-14 MED ORDER — ENOXAPARIN SODIUM 40 MG/0.4ML ~~LOC~~ SOLN
40.0000 mg | Freq: Every day | SUBCUTANEOUS | Status: DC
  Administered 2015-06-14 – 2015-06-15 (×3): 40 mg via SUBCUTANEOUS
  Filled 2015-06-14 (×4): qty 0.4

## 2015-06-14 MED ORDER — ACETAMINOPHEN 650 MG RE SUPP: 650.0000 mg | Freq: Four times a day (QID) | RECTAL | Status: DC | PRN

## 2015-06-14 MED ORDER — VANCOMYCIN HCL 10 G IV SOLR
1250.0000 mg | Freq: Once | INTRAVENOUS | Status: AC
  Administered 2015-06-14: 1250 mg via INTRAVENOUS
  Filled 2015-06-14: qty 1250

## 2015-06-14 MED ORDER — FERROUS SULFATE 325 (65 FE) MG PO TABS
325.0000 mg | ORAL_TABLET | Freq: Every day | ORAL | Status: DC
  Administered 2015-06-14 – 2015-06-16 (×3): 325 mg via ORAL
  Filled 2015-06-14 (×3): qty 1

## 2015-06-14 MED ORDER — QUETIAPINE FUMARATE 25 MG PO TABS
50.0000 mg | ORAL_TABLET | Freq: Every day | ORAL | Status: DC
  Administered 2015-06-14 – 2015-06-15 (×2): 50 mg via ORAL
  Filled 2015-06-14 (×3): qty 2

## 2015-06-14 MED ORDER — PIPERACILLIN-TAZOBACTAM 3.375 G IVPB
3.3750 g | Freq: Three times a day (TID) | INTRAVENOUS | Status: AC
  Administered 2015-06-14 – 2015-06-15 (×6): 3.375 g via INTRAVENOUS
  Filled 2015-06-14 (×6): qty 50

## 2015-06-14 MED ORDER — VANCOMYCIN HCL 10 G IV SOLR
1000.0000 mg | Freq: Once | INTRAVENOUS | Status: DC
  Filled 2015-06-14: qty 1000

## 2015-06-14 MED ORDER — LORAZEPAM 2 MG/ML IJ SOLN
INTRAMUSCULAR | Status: AC
  Administered 2015-06-14: 2 mg
  Filled 2015-06-14: qty 1

## 2015-06-14 MED ORDER — DOCUSATE SODIUM 100 MG PO CAPS
100.0000 mg | ORAL_CAPSULE | Freq: Every day | ORAL | Status: DC
  Administered 2015-06-14: 100 mg via ORAL
  Filled 2015-06-14 (×2): qty 1

## 2015-06-14 MED ORDER — POTASSIUM CHLORIDE CRYS ER 20 MEQ PO TBCR
40.0000 meq | EXTENDED_RELEASE_TABLET | ORAL | Status: AC
  Administered 2015-06-14 (×2): 40 meq via ORAL
  Filled 2015-06-14 (×2): qty 2

## 2015-06-14 MED ORDER — DULOXETINE HCL 30 MG PO CPEP
30.0000 mg | ORAL_CAPSULE | Freq: Every day | ORAL | Status: DC
  Administered 2015-06-14 – 2015-06-15 (×2): 30 mg via ORAL
  Filled 2015-06-14 (×2): qty 1

## 2015-06-14 MED ORDER — PRAVASTATIN SODIUM 20 MG PO TABS: 40.0000 mg | ORAL_TABLET | Freq: Every evening | ORAL | Status: DC

## 2015-06-14 MED ORDER — ATORVASTATIN CALCIUM 10 MG PO TABS: 10.0000 mg | ORAL_TABLET | Freq: Every day | ORAL | Status: DC

## 2015-06-14 MED ORDER — LORAZEPAM 0.5 MG PO TABS
0.5000 mg | ORAL_TABLET | ORAL | Status: DC | PRN
  Administered 2015-06-14 – 2015-06-15 (×2): 0.5 mg via ORAL
  Filled 2015-06-14 (×2): qty 1

## 2015-06-14 MED ORDER — BOOST PLUS PO LIQD: 237.0000 mL | Freq: Three times a day (TID) | ORAL | Status: DC

## 2015-06-14 MED ORDER — VANCOMYCIN HCL 10 G IV SOLR
1250.0000 mg | INTRAVENOUS | Status: DC
  Administered 2015-06-14 – 2015-06-15 (×2): 1250 mg via INTRAVENOUS
  Filled 2015-06-14 (×2): qty 1250

## 2015-06-14 MED ORDER — PNEUMOCOCCAL VAC POLYVALENT 25 MCG/0.5ML IJ INJ
0.5000 mL | INJECTION | INTRAMUSCULAR | Status: AC
  Administered 2015-06-15: 0.5 mL via INTRAMUSCULAR
  Filled 2015-06-14: qty 0.5

## 2015-06-14 MED ORDER — ACETAMINOPHEN 325 MG PO TABS: 650.0000 mg | ORAL_TABLET | Freq: Four times a day (QID) | ORAL | Status: DC | PRN

## 2015-06-14 MED ORDER — DONEPEZIL HCL 5 MG PO TABS
10.0000 mg | ORAL_TABLET | Freq: Every day | ORAL | Status: DC
  Administered 2015-06-14 – 2015-06-15 (×2): 10 mg via ORAL
  Filled 2015-06-14 (×3): qty 2

## 2015-06-14 MED ORDER — ENSURE ENLIVE PO LIQD
237.0000 mL | Freq: Three times a day (TID) | ORAL | Status: DC
  Administered 2015-06-14 – 2015-06-16 (×5): 237 mL via ORAL

## 2015-06-14 MED ORDER — SODIUM CHLORIDE 0.9 % IV SOLN
INTRAVENOUS | Status: DC
  Administered 2015-06-14 – 2015-06-16 (×4): via INTRAVENOUS

## 2015-06-14 MED ORDER — SODIUM CHLORIDE 0.9% FLUSH
3.0000 mL | Freq: Two times a day (BID) | INTRAVENOUS | Status: DC
  Administered 2015-06-14 – 2015-06-15 (×5): 3 mL via INTRAVENOUS

## 2015-06-14 NOTE — ED Notes (Signed)
Called pharmacy to request pt medication. Pharmacy reported they would tube up antibiotic

## 2015-06-14 NOTE — Progress Notes (Signed)
Pt has mittens on, and bedrails up, bed alarm on sensitive, pt is very confused and tends to get very agitated in the afternoon and evening, can be very combative.  MD contacted earlier for medication orders, family is very helpful when they are here.

## 2015-06-14 NOTE — ED Provider Notes (Signed)
[facility] Emergency Department Provider Note  ____________________________________________    I have reviewed the triage vital signs and the nursing notes.   HISTORY  Chief Complaint Urinary Tract Infection and Hypotension  History limited as patient is unable to provide extensive history  HPI Barry Coffey is a 82 y.o. male who is brought in by EMS for reported hypertension and possible UTI. Daughter reports the patient has been doing well recently but over the last day or 2 has had very low energy and has not been getting out of bed and is not eating. Subjective fevers are reported. Patient apparently was hypotensive per EMS     Past Medical History  Diagnosis Date  . Hyperlipidemia   . Dementia   . Depression   . Anxiety   . Bladder cancer ([place]) 2011    s/p resection with mitomycin injection;Dr.Stoioff  . COPD (chronic obstructive pulmonary disease) ([place])   . Diabetes [facility])     Patient Active Problem List   Diagnosis Date Noted  . Microscopic hematuria 03/06/2015  . History of bladder cancer 03/06/2015  . Nocturia 03/06/2015  . Bilateral renal cysts 03/06/2015  . Hip fracture ([place]) 12/17/2014  . Sinus bradycardia 01/04/2013  . Pre-syncope 04/10/2012    Past Surgical History  Procedure Laterality Date  . Back surgery      x4  . Cholecystectomy    . Foot surgery    . Femur im nail Left 12/18/2014    Procedure: INTRAMEDULLARY (IM) NAIL FEMORAL;  Surgeon: Thornton Park, MD;  Location: [facility] ORS;  Service: Orthopedics;  Laterality: Left;    Current Outpatient Rx  Name  Route  Sig  Dispense  Refill  . aluminum-magnesium hydroxide-simethicone (MAALOX) [id number] MG/5ML SUSP   Oral   Take by mouth.         Marland Kitchen atorvastatin (LIPITOR) 10 MG tablet   Oral   Take 10 mg by mouth.         . Cholecalciferol (D 5000) 5000 UNITS TABS   Oral   Take by mouth.         . citalopram (CELEXA) 10 MG tablet   Oral   Take by mouth.          . docusate sodium (COLACE) 100 MG capsule   Oral   Take 100 mg by mouth.         . donepezil (ARICEPT) 10 MG tablet   Oral   Take 10 mg by mouth at bedtime.         Marland Kitchen doxycycline (VIBRAMYCIN) 100 MG capsule   Oral   Take 100 mg by mouth 2 (two) times daily.         . DULoxetine (CYMBALTA) 30 MG capsule   Oral   Take 30 mg by mouth.         . enoxaparin (LOVENOX) 40 MG/0.4ML injection   Subcutaneous   Inject into the skin.         . ferrous sulfate 325 (65 FE) MG tablet   Oral   Take 1 tablet (325 mg total) by mouth daily with breakfast.   30 tablet   3   . HYDROcodone-acetaminophen (NORCO) 5-325 MG tablet   Oral   Take by mouth.         Marland Kitchen ipratropium (ATROVENT) 0.06 % nasal spray   Nasal   Place into the nose.         . levothyroxine (SYNTHROID, LEVOTHROID) 50 MCG tablet   Oral  Take 50 mcg by mouth daily before breakfast.          . magnesium hydroxide (MILK OF MAGNESIA) 400 MG/5ML suspension   Oral   Take by mouth.         . Naproxen Sodium (ALEVE) 220 MG CAPS   Oral   Take by mouth.         Marland Kitchen PARoxetine (PAXIL) 20 MG tablet   Oral   Take 20 mg by mouth.         . potassium chloride (K-DUR) 10 MEQ tablet   Oral   Take by mouth.         . potassium chloride (K-DUR,KLOR-CON) 10 MEQ tablet   Oral   Take by mouth.         . pravastatin (PRAVACHOL) 40 MG tablet   Oral   Take 40 mg by mouth every evening.          Marland Kitchen QUEtiapine (SEROQUEL) 25 MG tablet   Oral   Take 50 mg by mouth at bedtime.         . Vitamin D, Ergocalciferol, (DRISDOL) 50000 UNITS CAPS capsule   Oral   Take 50,000 Units by mouth every 7 (seven) days. Pt takes on Sunday.           Allergies Review of patient's allergies indicates no known allergies.  Family History  Problem Relation Age of Onset  . Hypertension Father   . Kidney failure Father   . Prostate cancer Brother     Social History Social History  Substance Use Topics  .  Smoking status: Former Smoker -- 1.50 packs/day for 25 years    Types: Cigarettes  . Smokeless tobacco: None     Comment: quit 50 years  . Alcohol Use: No    Level V caveat: Review of Systems Limited by patient's inability to provide history  Constitutional: EMS was told the patient did have a fever     ____________________________________________   PHYSICAL EXAM:  VITAL SIGNS: ED Triage Vitals  Enc Vitals Group     BP 06/13/15 1930 109/66 mmHg     Pulse Rate 06/13/15 1930 89     Resp 06/13/15 1930 29     Temp 06/13/15 1930 98.3 F (36.8 C)     Temp Source 06/13/15 1930 Oral     SpO2 06/13/15 1930 100 %     Weight 06/13/15 1930 150 lb (68.04 kg)     Height 06/13/15 1930 5\' 11"  (1.803 m)     Head Cir --      Peak Flow --      Pain Score --      Pain Loc --      Pain Edu? --      Excl. in [place]? --      Constitutional: Alert and oriented. Well appearing and in no distress. Eyes: Conjunctivae are normal.  ENT   Head: Normocephalic and atraumatic.   Mouth/Throat: Mucous membranes are moist. Cardiovascular: Normal rate, regular rhythm. Normal and symmetric distal pulses are present in all extremities.  Respiratory: Normal respiratory effort without tachypnea nor retractions. Breath sounds are clear and equal bilaterally.  Gastrointestinal: Soft and non-tender in all quadrants. No distention. There is no CVA tenderness. Genitourinary: deferred Musculoskeletal: Nontender with normal range of motion in all extremities.  Neurologic:  Normal speech and language. No gross focal neurologic deficits are appreciated. Skin:  Skin is warm, dry and intact. No rash noted. Psychiatric: Mood and affect are  normal. Patient exhibits appropriate insight and judgment.  ____________________________________________    LABS (pertinent positives/negatives)  Labs Reviewed  COMPREHENSIVE METABOLIC PANEL - Abnormal; Notable for the following:    Glucose, Bld 139 (*)    BUN 26 (*)     Calcium 8.3 (*)    Total Protein 6.2 (*)    Albumin 3.1 (*)    AST 134 (*)    ALT 144 (*)    Alkaline Phosphatase 136 (*)    GFR calc non Af Amer 53 (*)    All other components within normal limits  CBC WITH DIFFERENTIAL/PLATELET - Abnormal; Notable for the following:    WBC 10.7 (*)    RBC 3.96 (*)    Hemoglobin 12.5 (*)    HCT 38.3 (*)    RDW 15.3 (*)    Neutro Abs 9.5 (*)    Lymphs Abs 0.3 (*)    All other components within normal limits  LACTIC ACID, PLASMA - Abnormal; Notable for the following:    Lactic Acid, Venous 3.0 (*)    All other components within normal limits  URINALYSIS COMPLETEWITH MICROSCOPIC ([facility] ONLY) - Abnormal; Notable for the following:    Color, Urine AMBER (*)    APPearance HAZY (*)    Bilirubin Urine 1+ (*)    Protein, ur 100 (*)    Bacteria, UA RARE (*)    Squamous Epithelial / LPF 0-5 (*)    All other components within normal limits  CULTURE, BLOOD (ROUTINE X 2)  CULTURE, BLOOD (ROUTINE X 2)  URINE CULTURE  LACTIC ACID, PLASMA    ____________________________________________   EKG  None  ____________________________________________    RADIOLOGY I have personally reviewed any xrays that were ordered on this patient: Chest x-ray unremarkable  ____________________________________________   PROCEDURES  Procedure(s) performed: none  Critical Care performed:none  ____________________________________________   INITIAL IMPRESSION / ASSESSMENT AND PLAN / ED COURSE  Pertinent labs & imaging results that were available during my care of the patient were reviewed by me and considered in my medical decision making (see chart for details).  Patient presents with essentially failure to thrive but also concern for fever and hypotension secondary to a UTI. Patient was normotensive in the ED and his heart rate is normal. We will start IV fluids and check lactic acid blood cultures and urine and chest x-ray and reevaluate  Workup was  essentially negative except for an elevated lactic acid. I will give Rocephin and admitted to the hospital as the daughter reports the patient does want to be treated despite being on home hospice  ____________________________________________   FINAL CLINICAL IMPRESSION(S) / ED DIAGNOSES  Final diagnoses:  Failure to thrive in adult  Sepsis, due to unspecified organism [facility])     Lavonia Drafts, MD 06/14/15 [phone]

## 2015-06-14 NOTE — Progress Notes (Signed)
[facility] [place] NAME: Barry Coffey    MR#:  [id number]  DATE OF BIRTH:  04/07/1926  SUBJECTIVE: 82 year old male patient with dementia, hyperlipidemia, COPD admitted because of worsening confusion, poor by mouth intake for the last 2 days. Admitted for sepsis of unknown origin with lactic acid of 3. 0. patient is demented but according to patient's daughter-in-law he is better using more oriented today than yesterday. Still has poor by mouth intake. He was followed by hospice at home but they discharged him recently.   CHIEF COMPLAINT:   Chief Complaint  Patient presents with  . Urinary Tract Infection  . Hypotension    REVIEW OF SYSTEMS:    Review of Systems  Unable to perform ROS: dementia    Nutrition:  Tolerating Diet: Tolerating PT:      DRUG ALLERGIES:  No Known Allergies  VITALS:  Blood pressure 122/65, pulse 63, temperature 97.8 F (36.6 C), temperature source Oral, resp. rate 20, height 5\' 10"  (1.778 m), weight 69.037 kg (152 lb 3.2 oz), SpO2 99 %.  PHYSICAL EXAMINATION:   Physical Exam  GENERAL:  82 y.o.-year-old patient lying in the bed with no acute distress.  EYES: Pupils equal, round, reactive to light and accommodation. No scleral icterus. Extraocular muscles intact.  HEENT: Head atraumatic, normocephalic. Oropharynx and nasopharynx clear.  NECK:  Supple, no jugular venous distention. No thyroid enlargement, no tenderness.  LUNGS: Normal breath sounds bilaterally, no wheezing, rales,rhonchi or crepitation. No use of accessory muscles of respiration.  CARDIOVASCULAR: S1, S2 normal. No murmurs, rubs, or gallops.  ABDOMEN: Soft, nontender, nondistended. Bowel sounds present. No organomegaly or mass.  EXTREMITIES: No pedal edema, cyanosis, or clubbing.  NEUROLOGIC: Cranial nerves II through XII are intact. Muscle strength 5/5 in all extremities. Sensation intact. Gait not checked.  PSYCHIATRIC: Awake  but disoriented,.  SKIN: No obvious rash, lesion, or ulcer.    LABORATORY PANEL:   CBC  Recent Labs Lab 06/14/15 0316  WBC 7.0  HGB 11.4*  HCT 34.1*  PLT 184   ------------------------------------------------------------------------------------------------------------------  Chemistries   Recent Labs Lab 06/13/15 2004 06/14/15 0316  NA 143 144  K 3.7 3.3*  CL 110 114*  CO2 24 23  GLUCOSE 139* 110*  BUN 26* 26*  CREATININE 1.20 1.03  CALCIUM 8.3* 8.2*  AST 134*  --   ALT 144*  --   ALKPHOS 136*  --   BILITOT 1.2  --    ------------------------------------------------------------------------------------------------------------------  Cardiac Enzymes No results for input(s): TROPONINI in the last 168 hours. ------------------------------------------------------------------------------------------------------------------  RADIOLOGY:  Dg Chest Portable 1 View  06/13/2015  CLINICAL DATA:  82 year old male with weakness EXAM: PORTABLE CHEST 1 VIEW COMPARISON:  Radiograph dated 03/23/2015 FINDINGS: Single portable view of the chest does not demonstrate a focal consolidation. There is no pleural effusion or pneumothorax. Small faint linear atelectasis/scarring noted in the right mid lung field. The cardiac silhouette is within normal limits with there is osteopenia with degenerative changes of the spine. No acute osseous pathology. IMPRESSION: No active disease. Electronically Signed   By: Anner Crete M.D.   On: 06/13/2015 20:29     ASSESSMENT AND PLAN:   Principal Problem:   Sepsis ([place]) Active Problems:   Altered mental status   Pressure ulcer   #1 sepsis of unknown origin: Blood cultures, urine cultures are negative so far. Patient has stage I sacral decubiti but no drainage. Continue to follow for another 24 hours on culture  data. Continue IV hydration, empiric IV antibiotics. #2 acute renal failure due to dehydration, poor by mouth intake: Due to ATN  improved with IV hydration. #3 hypokalemia replace the potassium. #4 severe dementia and continue Cymbalta, Aricept, Celexa, Paxil, Seroquel. No change in medication dosages recently. Hypothyroidism continue Synthroid.  #6 of bladder cancer:;  7 hypothermia; with the core body temperature 91.7 Fat home ,it resolved. D/w DIL More than 50% of time spent in coordination and counseling and the charts.  All the records are reviewed and case discussed with Care Management/Social Workerr. Management plans discussed with the patient, family and they are in agreement.  CODE STATUS: full  TOTAL TIME TAKING CARE OF THIS PATIENT: 75minutes.   POSSIBLE D/C IN 1-2 DAYS, DEPENDING ON CLINICAL CONDITION.   Epifanio Lesches M.D on 06/14/2015 at 11:22 AM  Between 7am to 6pm - Pager - [phone]  After 6pm go to [email or web address] - password [email or web address] [place] [facility]  Office  [phone]  CC: Primary care physician; Tracie Harrier, MD

## 2015-06-14 NOTE — Plan of Care (Signed)
Problem: Education: Goal: Knowledge of [facility] Education information/materials will improve Outcome: Completed/Met Date Met:  06/14/15 educated family  Problem: Pain Managment: Goal: General experience of comfort will improve Outcome: Completed/Met Date Met:  06/14/15 Facial expressions do not indicate pain

## 2015-06-14 NOTE — ED Notes (Signed)
Pt's caregiver, Jamarious Shon (also Power of attorney) would like to be notified for any changes in pt status. [phone]

## 2015-06-14 NOTE — Progress Notes (Signed)
Per MD Vianne Bulls start lorazepam as patient takes at home and stop cholesterol medication,  MD aware that patient's does not take nearly as many meds at home as we have ordered here, stated she forgot to make changes before she left

## 2015-06-14 NOTE — Progress Notes (Signed)
Patient's home med list updated per Hospice and Dr Vianne Bulls notified.  Patient takes very little medication at home now.

## 2015-06-14 NOTE — H&P (Addendum)
[facility] [place] NAME: Barry Coffey    MR#:  [id number]  DATE OF BIRTH:  [birth year]  DATE OF ADMISSION:  06/13/2015  PRIMARY CARE PHYSICIAN: Tracie Harrier, MD   REQUESTING/REFERRING PHYSICIAN:   CHIEF COMPLAINT:   Chief Complaint  Patient presents with  . Urinary Tract Infection  . Hypotension    HISTORY OF PRESENT ILLNESS: Barry Coffey  is a 82 y.o. male with a known history of dementia, hyperlipidemia, bladder cancer, COPD, diabetes mellitus presented to the emergency room for a fever. Patient was brought by the daughter as he was having low energy levels for the last 2 days. He was also a little bit confused. Patient has history of dementia and dependent on activities of daily living. Not much history could be obtained from the patient. Family was not available at bedside. Most of the history of pain from the ER physician and ER chart. Patient blood pressure was stable in the emergency room and during the workup his lactate level was high. No history of any fever in the emergency room. No shortness of breath. No nausea or vomiting or diarrhea. No history of fall.  PAST MEDICAL HISTORY:   Past Medical History  Diagnosis Date  . Hyperlipidemia   . Dementia   . Depression   . Anxiety   . Bladder cancer ([place]) 2011    s/p resection with mitomycin injection;Dr.Stoioff  . COPD (chronic obstructive pulmonary disease) ([place])   . Diabetes ([place])     PAST SURGICAL HISTORY: Past Surgical History  Procedure Laterality Date  . Back surgery      x4  . Cholecystectomy    . Foot surgery    . Femur im nail Left 12/18/2014    Procedure: INTRAMEDULLARY (IM) NAIL FEMORAL;  Surgeon: Thornton Park, MD;  Location: [facility] ORS;  Service: Orthopedics;  Laterality: Left;    SOCIAL HISTORY:  Social History  Substance Use Topics  . Smoking status: Former Smoker -- 1.50 packs/day for 25 years    Types: Cigarettes  . Smokeless tobacco:  Not on file     Comment: quit 50 years  . Alcohol Use: No    FAMILY HISTORY:  Family History  Problem Relation Age of Onset  . Hypertension Father   . Kidney failure Father   . Prostate cancer Brother     DRUG ALLERGIES: No Known Allergies  REVIEW OF SYSTEMS:   CONSTITUTIONAL:  Fever at home, has weakness.  EYES: No blurred or double vision.  EARS, NOSE, AND THROAT: No tinnitus or ear pain.  RESPIRATORY: No cough, shortness of breath, wheezing or hemoptysis.  CARDIOVASCULAR: No chest pain, orthopnea, edema.  GASTROINTESTINAL: No nausea, vomiting, diarrhea or abdominal pain.  GENITOURINARY: No dysuria, hematuria.  ENDOCRINE: No polyuria, nocturia,  HEMATOLOGY: No anemia, easy bruising or bleeding SKIN: No rash , decubitus ulcer noted MUSCULOSKELETAL: No joint pain or arthritis.   NEUROLOGIC: No tingling, numbness, weakness.  PSYCHIATRY: No anxiety or depression.   MEDICATIONS AT HOME:  Prior to Admission medications   Medication Sig Start Date End Date Taking? Authorizing Provider  aluminum-magnesium hydroxide-simethicone (MAALOX) 200-200-20 MG/5ML SUSP Take by mouth. 12/21/14   Historical Provider, MD  atorvastatin (LIPITOR) 10 MG tablet Take 10 mg by mouth.    Historical Provider, MD  Cholecalciferol (D 5000) 5000 UNITS TABS Take by mouth.    Historical Provider, MD  citalopram (CELEXA) 10 MG tablet Take by mouth.    Historical Provider, MD  docusate  sodium (COLACE) 100 MG capsule Take 100 mg by mouth. 12/21/14   Historical Provider, MD  donepezil (ARICEPT) 10 MG tablet Take 10 mg by mouth at bedtime.    Historical Provider, MD  doxycycline (VIBRAMYCIN) 100 MG capsule Take 100 mg by mouth 2 (two) times daily.    Historical Provider, MD  DULoxetine (CYMBALTA) 30 MG capsule Take 30 mg by mouth.    Historical Provider, MD  enoxaparin (LOVENOX) 40 MG/0.4ML injection Inject into the skin.    Historical Provider, MD  ferrous sulfate 325 (65 FE) MG tablet Take 1 tablet (325 mg  total) by mouth daily with breakfast. 12/21/14   Adrian Prows, MD  HYDROcodone-acetaminophen [facility]) 5-325 MG tablet Take by mouth. 12/21/14   Historical Provider, MD  ipratropium (ATROVENT) 0.06 % nasal spray Place into the nose.    Historical Provider, MD  levothyroxine (SYNTHROID, LEVOTHROID) 50 MCG tablet Take 50 mcg by mouth daily before breakfast.     Historical Provider, MD  magnesium hydroxide (MILK OF MAGNESIA) 400 MG/5ML suspension Take by mouth. 12/21/14   Historical Provider, MD  Naproxen Sodium (ALEVE) 220 MG CAPS Take by mouth.    Historical Provider, MD  PARoxetine (PAXIL) 20 MG tablet Take 20 mg by mouth.    Historical Provider, MD  potassium chloride (K-DUR) 10 MEQ tablet Take by mouth.    Historical Provider, MD  potassium chloride (K-DUR,KLOR-CON) 10 MEQ tablet Take by mouth.    Historical Provider, MD  pravastatin (PRAVACHOL) 40 MG tablet Take 40 mg by mouth every evening.     Historical Provider, MD  QUEtiapine (SEROQUEL) 25 MG tablet Take 50 mg by mouth at bedtime.    Historical Provider, MD  Vitamin D, Ergocalciferol, (DRISDOL) 50000 UNITS CAPS capsule Take 50,000 Units by mouth every 7 (seven) days. Pt takes on Sunday.    Historical Provider, MD      PHYSICAL EXAMINATION:   VITAL SIGNS: Blood pressure 108/80, pulse 71, temperature 98.7 F (37.1 C), temperature source Oral, resp. rate 13, height 5\' 11"  (1.803 m), weight 68.04 kg (150 lb), SpO2 99 %.  GENERAL:  82 y.o.-year-old patient lying in the bed with no acute distress.  EYES: Pupils equal, round, reactive to light and accommodation. No scleral icterus. Extraocular muscles intact.  HEENT: Head atraumatic, normocephalic. Oropharynx dry and nasopharynx clear.  NECK:  Supple, no jugular venous distention. No thyroid enlargement, no tenderness.  LUNGS: Normal breath sounds bilaterally, no wheezing, rales,rhonchi or crepitation. No use of accessory muscles of respiration.  CARDIOVASCULAR: S1, S2 normal. No murmurs,  rubs, or gallops.  ABDOMEN: Soft, nontender, nondistended. Bowel sounds present. No organomegaly or mass.  EXTREMITIES: No pedal edema, cyanosis, or clubbing.  NEUROLOGIC: Awake and alert, moves all extremities. PSYCHIATRIC: demented SKIN: No obvious rash, stage I decubitus ulcer noted.  LABORATORY PANEL:   CBC  Recent Labs Lab 06/13/15 2004  WBC 10.7*  HGB 12.5*  HCT 38.3*  PLT 222  MCV 96.7  MCH 31.7  MCHC 32.8  RDW 15.3*  LYMPHSABS 0.3*  MONOABS 0.9  EOSABS 0.0  BASOSABS 0.0   ------------------------------------------------------------------------------------------------------------------  Chemistries   Recent Labs Lab 06/13/15 2004  NA 143  K 3.7  CL 110  CO2 24  GLUCOSE 139*  BUN 26*  CREATININE 1.20  CALCIUM 8.3*  AST 134*  ALT 144*  ALKPHOS 136*  BILITOT 1.2   ------------------------------------------------------------------------------------------------------------------ estimated creatinine clearance is 41.7 mL/min (by C-G formula based on Cr of 1.2). ------------------------------------------------------------------------------------------------------------------ No results for input(s): TSH, T4TOTAL,  T3FREE, THYROIDAB in the last 72 hours.  Invalid input(s): FREET3   Coagulation profile No results for input(s): INR, PROTIME in the last 168 hours. ------------------------------------------------------------------------------------------------------------------- No results for input(s): DDIMER in the last 72 hours. -------------------------------------------------------------------------------------------------------------------  Cardiac Enzymes No results for input(s): CKMB, TROPONINI, MYOGLOBIN in the last 168 hours.  Invalid input(s): CK ------------------------------------------------------------------------------------------------------------------ Invalid input(s):  POCBNP  ---------------------------------------------------------------------------------------------------------------  Urinalysis    Component Value Date/Time   COLORURINE AMBER* 06/13/2015 2004   COLORURINE Amber 07/23/2013 1229   APPEARANCEUR HAZY* 06/13/2015 2004   APPEARANCEUR Clear 07/23/2013 1229   LABSPEC 1.027 06/13/2015 2004   LABSPEC 1.020 07/23/2013 1229   PHURINE 5.0 06/13/2015 2004   PHURINE 6.0 07/23/2013 1229   GLUCOSEU NEGATIVE 06/13/2015 2004   GLUCOSEU Negative 07/23/2013 1229   HGBUR NEGATIVE 06/13/2015 2004   HGBUR 1+ 07/23/2013 1229   BILIRUBINUR 1+* 06/13/2015 2004   BILIRUBINUR Negative 03/05/2015 1521   BILIRUBINUR Negative 07/23/2013 [place] 06/13/2015 2004   KETONESUR Negative 07/23/2013 1229   PROTEINUR 100* 06/13/2015 2004   PROTEINUR 30 mg/dL 07/23/2013 1229   NITRITE NEGATIVE 06/13/2015 2004   NITRITE Negative 03/05/2015 1521   NITRITE Negative 07/23/2013 1229   LEUKOCYTESUR NEGATIVE 06/13/2015 2004   LEUKOCYTESUR Negative 03/05/2015 1521   LEUKOCYTESUR Negative 07/23/2013 1229     RADIOLOGY: Dg Chest Portable 1 View  06/13/2015  CLINICAL DATA:  82 year old male with weakness EXAM: PORTABLE CHEST 1 VIEW COMPARISON:  Radiograph dated 03/23/2015 FINDINGS: Single portable view of the chest does not demonstrate a focal consolidation. There is no pleural effusion or pneumothorax. Small faint linear atelectasis/scarring noted in the right mid lung field. The cardiac silhouette is within normal limits with there is osteopenia with degenerative changes of the spine. No acute osseous pathology. IMPRESSION: No active disease. Electronically Signed   By: Anner Crete M.D.   On: 06/13/2015 20:29    EKG: Orders placed or performed during the hospital encounter of 03/23/15  . ED EKG  . ED EKG    IMPRESSION AND PLAN: 82 year old elderly male patient with history of dementia, hyperlipidemia, bladder cancer, COPD presented to the  emergency room with weakness and fever at home. Admitting diagnosis 1. Dehydration 2. Sepsis of unknown source 3. Dementia 4. Hyperlipidemia 5. Emphysema Treatment plan Admit patient to telemetry IV fluid hydration Start patient on IV vancomycin and Zosyn antibiotic Follow-up cultures Supportive care All the records are reviewed and case discussed with ED provider. Management plans discussed with the patient, family and they are in agreement.  CODE STATUS:FULL    Code Status Orders        Start     Ordered   06/14/15 0113  Full code   Continuous     06/14/15 0113    Code Status History    Date Active Date Inactive Code Status Order ID Comments User Context   12/18/2014  2:12 PM 12/21/2014  4:33 PM Full Code [id number]  Thornton Park, MD Inpatient   12/17/2014 11:03 PM 12/18/2014  2:12 PM Full Code [id number]  Thornton Park, MD Inpatient   12/17/2014  8:15 PM 12/17/2014 11:03 PM Full Code [id number]  Gladstone Lighter, MD ED    Advance Directive Documentation        Most Recent Value   Type of Advance Directive  Healthcare Power of Attorney [Daughter in [place], [place]]   Pre-existing out of facility DNR order (yellow form or pink MOST form)     "MOST" Form in Place?  TOTAL TIME TAKING CARE OF THIS PATIENT: 50 minutes.    Saundra Shelling M.D on 06/14/2015 at 1:19 AM  Between 7am to 6pm - Pager - [phone]  After 6pm go to [email or web address] - password [email or web address] [facility]  Office  [phone]  CC: Primary care physician; Tracie Harrier, MD

## 2015-06-14 NOTE — Progress Notes (Signed)
Per MD Vianne Bulls order IV lorazepam for agitaion

## 2015-06-14 NOTE — ED Notes (Addendum)
Changed Pt's incontinence brief. Stool had very strong odor.  Stage 1 pressure ulcer noted on sacrum.

## 2015-06-14 NOTE — Progress Notes (Signed)
Pharmacy Antibiotic Note  Barry Coffey is a 82 y.o. male admitted on 06/13/2015 with sepsis.  Pharmacy has been consulted for vancomycin and Zosyn dosing.  Plan: 1. Zosyn 3.375 gm IV Q8H EI 2. Vancomycin 1.25 gm IV Q24H with stacked dosing, second dose approximately 9 hours after first, predicted trough 17 mcg/mL. Pharmacy will continue to follow and adjust as needed to maintain trough 15 to 20 mcg/mL  Vd 48.3 L, Ke 0.04 hr-1, T1/2 17.5 hr  Height: 5\' 10"  (177.8 cm) Weight: 152 lb 3.2 oz (69.037 kg) IBW/kg (Calculated) : 73  Temp (24hrs), Avg:98.2 F (36.8 C), Min:97.7 F (36.5 C), Max:98.7 F (37.1 C)   Recent Labs Lab 06/13/15 2004 06/13/15 2304  WBC 10.7*  --   CREATININE 1.20  --   LATICACIDVEN 3.0* 1.2    Estimated Creatinine Clearance: 42.3 mL/min (by C-G formula based on Cr of 1.2).    No Known Allergies   Thank you for allowing pharmacy to be a part of this patient's care.  Laural Benes, Pharm.D., BCPS Clinical Pharmacist 06/14/2015 3:14 AM

## 2015-06-14 NOTE — ED Notes (Signed)
Called floor to let them know pt on the way up 

## 2015-06-15 LAB — URINE CULTURE: Culture: NO GROWTH

## 2015-06-15 NOTE — Consult Note (Addendum)
WOC wound consult note Reason for Consult: Bilateral boggy heels.  Skin tear to right elbow, scabbed and dry at this time. Patient is in mittens to prevent further injury to self.  Wound type: Trauma wound to right elbow.  Bilateral boggy  Heels.  Pressure Ulcer POA: Yes Measurement: Right elbow 2 cm x 0.5 cm scabbed lesion.  Bilateral boggy heels 2 cm x 2 cm intact nonblanchable erythema.  Nonblanchable erythema to sacrum, silicone border foam in place Wound [id number] right elbow Drainage (amount, consistency, odor) None Periwound:Scattered abrasions to bilateral upper extremities.  Dressing procedure/placement/frequency:Prevalon boot to bilateral heels.  Dry dressing to skin tear on right elbow.  Turn and reposition every two hours.  Will order mattress with low air loss feature.  Will not follow at this time.  Please re-consult if needed.  Domenic Moras RN BSN [place] Pager [phone]

## 2015-06-15 NOTE — Clinical Documentation Improvement (Signed)
Internal Medicine  Patient with diagnoses of Failure to Thrive, Severe Dementia, Bladder Cancer. Mr. Dinan is 5\' 11"  tall, weighs 150 pounds and has a BMI of 21. Please provide a diagnosis associated with clinical indicators if applicable, and document response in next progress note NOT in BPA drop down box. Thank you!   Other  Clinically Undetermined  Supporting Information:  As above  Please exercise your independent, professional judgment when responding. A specific answer is not anticipated or expected.  Thank You, Zoila Shutter RN, BSN, [place] [phone]; Cell: [phone]

## 2015-06-15 NOTE — Progress Notes (Signed)
Per Hospice RN note, patient is a DNR. CHL order currently lists Full Code. Daughter, [name] Vivas called (HCPOA) and RN confirmed with her that patient is a DNR. [facility] paged to see if this order can be corrected. MD to place orders.

## 2015-06-15 NOTE — Progress Notes (Signed)
CSW was contacted by MD Vianne Bulls inquiring if patient is open with [place]. CSW informed MD that patient is. MD reports that family stated that patient was discharged from Hospice care. [facility] and confirmed that patient is still open with [place]. She reports that patient was going to be discharged today but due to his hospitalization he has not been discharged. CSW informed RN Case Manager of above. There are no CSW needs at this time. CSW is available if a CSW need were to arise. CSW is signing off.   Ernest Pine, MSW, [place] Social Work Department [phone]

## 2015-06-15 NOTE — Progress Notes (Signed)
Updated patients daughter in law via telephone.

## 2015-06-15 NOTE — Progress Notes (Addendum)
Visit made. Patient is currently followed by Hospice and [place] at home with a hospice diagnosis of Protein Calorie malnutrition. He is a DNR and has an out of facility DNR in place in his home. Patient at his baseline ambulates with a walker and feeds himself. He has been admitted for treatment of sepsis related to a UTI. Patient seen lying in bed, alert, did respond to simple questions. Lunch tray at bedside, patient fed during visit. He ate approximately 25% and drank his tea and Ensure as well as 3/4 of a cup of ice cream. Patient with no difficulty swallowing. He remained pleasant and cooperative through out visit, oriented to self. He is currently receiving  IV fluids. Per chart note review IV antibiotics have been discontinued.  Will continue to follow and update hospice team. [name] RN, BSN, [place] [facility] [place], [facility] [phone] c

## 2015-06-15 NOTE — Progress Notes (Signed)
[facility] called, bed is en route. Prevalon boots placed per order.

## 2015-06-15 NOTE — Care Management (Addendum)
Received notification from Santiago Glad that patient is currently open to  [facility].

## 2015-06-15 NOTE — Progress Notes (Signed)
[place] [facility] [place] NAME: Barry Coffey    MR#:  [id number]  DATE OF BIRTH:  [birth year]  SUBJECTIVE: 82 year old male patient with dementia, hyperlipidemia, COPD admitted because of worsening confusion, poor by mouth intake for the last 2 days. Admitted for sepsis of unknown origin with lactic acid of 3. 0.  History of dementia. Poor by mouth intake. Agitated yesterday so had to get IV Ativan   CHIEF COMPLAINT:   Chief Complaint  Patient presents with  . Urinary Tract Infection  . Hypotension    REVIEW OF SYSTEMS:    Review of Systems  Unable to perform ROS: dementia    Nutrition:  Tolerating Diet: Not eating     DRUG ALLERGIES:  No Known Allergies  VITALS:  Blood pressure 147/68, pulse 66, temperature 98.2 F (36.8 C), temperature source Oral, resp. rate 18, height 5\' 10"  (1.778 m), weight 69.037 kg (152 lb 3.2 oz), SpO2 98 %.  PHYSICAL EXAMINATION:   Physical Exam  GENERAL:  82 y.o.-year-old patient lying in the bed with no acute distress.  EYES: Pupils equal, round, reactive to light and accommodation. No scleral icterus. Extraocular muscles intact.  HEENT: Head atraumatic, normocephalic. Oropharynx and nasopharynx clear.  NECK:  Supple, no jugular venous distention. No thyroid enlargement, no tenderness.  LUNGS: Normal breath sounds bilaterally, no wheezing, rales,rhonchi or crepitation. No use of accessory muscles of respiration.  CARDIOVASCULAR: S1, S2 normal. No murmurs, rubs, or gallops.  ABDOMEN: Soft, nontender, nondistended. Bowel sounds present. No organomegaly or mass.  EXTREMITIES: No pedal edema, cyanosis, or clubbing.  NEUROLOGIC: Not able to do full neurological exam because of dementia and not following commands.  PSYCHIATRIC: Awake but disoriented,.  SKIN: No obvious rash, lesion, or ulcer.    LABORATORY PANEL:   CBC  Recent Labs Lab 06/14/15 0316  WBC 7.0  HGB 11.4*  HCT 34.1*  PLT  184   ------------------------------------------------------------------------------------------------------------------  Chemistries   Recent Labs Lab 06/13/15 2004 06/14/15 0316  NA 143 144  K 3.7 3.3*  CL 110 114*  CO2 24 23  GLUCOSE 139* 110*  BUN 26* 26*  CREATININE 1.20 1.03  CALCIUM 8.3* 8.2*  AST 134*  --   ALT 144*  --   ALKPHOS 136*  --   BILITOT 1.2  --    ------------------------------------------------------------------------------------------------------------------  Cardiac Enzymes No results for input(s): TROPONINI in the last 168 hours. ------------------------------------------------------------------------------------------------------------------  RADIOLOGY:  Dg Chest Portable 1 View  06/13/2015  CLINICAL DATA:  82 year old male with weakness EXAM: PORTABLE CHEST 1 VIEW COMPARISON:  Radiograph dated 03/23/2015 FINDINGS: Single portable view of the chest does not demonstrate a focal consolidation. There is no pleural effusion or pneumothorax. Small faint linear atelectasis/scarring noted in the right mid lung field. The cardiac silhouette is within normal limits with there is osteopenia with degenerative changes of the spine. No acute osseous pathology. IMPRESSION: No active disease. Electronically Signed   By: Anner Crete M.D.   On: 06/13/2015 20:29     ASSESSMENT AND PLAN:   Principal Problem:   Sepsis ([place]) Active Problems:   Altered mental status   Pressure ulcer   #1 sepsis of unknown origin: Blood cultures, urine cultures are negative so far. Patient has stage I sacral decubiti but no drainage.  Continue  IV hydration because of poor by mouth intake. Culture data is still negative.d/c abx #2 acute renal failure due to dehydration, poor by mouth intake: Due to ATN improved with  IV hydration. #3 hypokalemia replace the potassium. #4 severe dementia and continue Cymbalta, Aricept, Celexa, Paxil, Seroquel. No change in medication dosages  recently.give IV ativan for agitation Hypothyroidism continue Synthroid.  #6 of bladder cancer:;  7 hypothermia; with the core body temperature 91.7 Fat home ,it resolved. D/w DIL   8. Failure to thrive secondary to dementia and poor by mouth intake:appreciate palliative care input.recently discharged from Hospice services as per DIL.CM to help with  Dispo PT consult. More than 50% of time spent in coordination and counseling and the charts.  All the records are reviewed and case discussed with Care Management/Social Workerr. Management plans discussed with the patient, family and they are in agreement.  CODE STATUS: full  TOTAL TIME TAKING CARE OF THIS PATIENT: 39minutes.   POSSIBLE D/C IN 1-2 DAYS, DEPENDING ON CLINICAL CONDITION.   Epifanio Lesches M.D on 06/15/2015 at 11:31 AM  Between 7am to 6pm - Pager - [phone]  After 6pm go to [email or web address] - password [email or web address] [place] [facility]  Office  [phone]  CC: Primary care physician; Tracie Harrier, MD

## 2015-06-15 NOTE — Progress Notes (Signed)
Pharmacy Antibiotic Note  Barry Coffey is a 82 y.o. male admitted on 06/13/2015 with fever of unknown origin.  Pharmacy has been consulted for Vancomycin and Zosyn dosing.  Plan: After discussion with Dr. Vianne Bulls, antibiotics will be discontinued.  Height: 5\' 10"  (177.8 cm) Weight: 152 lb 3.2 oz (69.037 kg) IBW/kg (Calculated) : 73  Temp (24hrs), Avg:97.8 F (36.6 C), Min:97 F (36.1 C), Max:98.2 F (36.8 C)   Recent Labs Lab 06/13/15 2004 06/13/15 2304 06/14/15 0316  WBC 10.7*  --  7.0  CREATININE 1.20  --  1.03  LATICACIDVEN 3.0* 1.2  --     Estimated Creatinine Clearance: 49.3 mL/min (by C-G formula based on Cr of 1.03).    No Known Allergies  Antimicrobials this admission: Vancomycin 2/19 >> 2/20 Zosyn 2/19 >> 2/20  Dose adjustments this admission:  Microbiology results: 2/19 BCx: no growth 2/19 UCx: no growth   Thank you for allowing pharmacy to be a part of this patient's care.  Paulina Fusi, PharmD, BCPS 06/15/2015 2:19 PM

## 2015-06-16 LAB — BASIC METABOLIC PANEL
Anion gap: 4 — ABNORMAL LOW (ref 5–15)
BUN: 12 mg/dL (ref 6–20)
CHLORIDE: 113 mmol/L — AB (ref 101–111)
CO2: 25 mmol/L (ref 22–32)
CREATININE: 0.88 mg/dL (ref 0.61–1.24)
Calcium: 8 mg/dL — ABNORMAL LOW (ref 8.9–10.3)
GFR calc Af Amer: >60 mL/min (ref >60)
GFR calc non Af Amer: >60 mL/min (ref >60)
GLUCOSE: 87 mg/dL (ref 65–99)
POTASSIUM: 3.3 mmol/L — AB (ref 3.5–5.1)
Sodium: 142 mmol/L (ref 135–145)

## 2015-06-16 MED ORDER — POTASSIUM CHLORIDE CRYS ER 20 MEQ PO TBCR
40.0000 meq | EXTENDED_RELEASE_TABLET | Freq: Once | ORAL | Status: DC
  Filled 2015-06-16: qty 2

## 2015-06-16 MED ORDER — POTASSIUM CHLORIDE IN NACL 20-0.9 MEQ/L-% IV SOLN
INTRAVENOUS | Status: DC
  Administered 2015-06-16 – 2015-06-17 (×2): via INTRAVENOUS
  Filled 2015-06-16 (×2): qty 1000

## 2015-06-16 NOTE — Progress Notes (Signed)
Patient refusing to take PO potassium. Dr. Vianne Bulls notified. Orders to change fluids to NS20K at 70mL/hr.

## 2015-06-16 NOTE — Evaluation (Signed)
Physical Therapy Evaluation Patient Details Name: Barry Coffey MRN: [id number] DOB: Apr 14, 1926 Today's Date: 06/16/2015   History of Present Illness  82 yo M presented to ED with found to have sepsis and failure to thrive with AMS. PMH includes dementia, hyperlipidemia, bladder cancer, COPD and DM. He recently has been under care of Hospice/ Palliative of [facility] for protien calorie malnutrition.  Clinical Impression  Pt presents with generalized weakness and decreased functional mobility. He performs bed mobility with min assistance and STS with min A and FWW. His seated and standing balance is fair. HR was 84 bpm. Pt's level of cognition limits ability to follow commands and participate. He is impulsive and agitated at times. Pt will benefit from skilled PT services to increase functional I and reduce caregiver burden of care.     Follow Up Recommendations No PT follow up;Supervision/Assistance - 24 hour;Supervision for mobility/OOB (Unknown if pt will continue with Hospice/ Palliative Care )    Equipment Recommendations  Rolling walker with 5" wheels;Hospital bed;Other (comment) (Pt poor historian, unsure if he already has these things.)    Recommendations for Other Services       Precautions / Restrictions Precautions Precautions: Fall Restrictions Weight Bearing Restrictions: No      Mobility  Bed Mobility Overal bed mobility: Needs Assistance Bed Mobility: Supine to Sit;Sit to Supine     Supine to sit: Min assist;HOB elevated Sit to supine: Min assist;HOB elevated   General bed mobility comments: uses rail  Transfers Overall transfer level: Needs assistance Equipment used: Rolling walker (2 wheeled) Transfers: Sit to/from Stand Sit to Stand: Min assist         General transfer comment: cues for hand placement and anterior weight shifting to maintain balance  Ambulation/Gait             General Gait Details: NT due to safety concerns and pt  agitation  Stairs            Wheelchair Mobility    Modified Rankin (Stroke Patients Only)       Balance Overall balance assessment: Needs assistance Sitting-balance support: Bilateral upper extremity supported;Feet supported Sitting balance-Leahy Scale: Fair Sitting balance - Comments: maintains static balance without trunk support Postural control: Posterior lean Standing balance support: Bilateral upper extremity supported Standing balance-Leahy Scale: Fair Standing balance comment: poor posture, posterior lean                             Pertinent Vitals/Pain Pain Assessment: No/denies pain    Home Living Family/patient expects to be discharged to:: Private residence Living Arrangements: Children Available Help at Discharge: Other (Comment) (pt poor historian and cannot provide information) Type of Home:  (pt poor historian, unable to provide information) Home Access: Other (comment) (unable to determine, pt poor historian)     Home Layout: Other (Comment) (unable to determine pt is poor historian) Home Equipment: Other (comment) (pt poor historian unable to determine) Additional Comments: Pt has been under the care of [facility].     Prior Function Level of Independence: Needs assistance   Gait / Transfers Assistance Needed: Pt ambulated limited distances with FWW per [facility] Liaison  ADL's / Homemaking Assistance Needed: At baseline pt is dependent for ADLs per [facility] Liaison        Hand Dominance  Extremity/Trunk Assessment   Upper Extremity Assessment: Generalized weakness;Difficult to assess due to impaired cognition           Lower Extremity Assessment: Generalized weakness;Difficult to assess due to impaired cognition      Cervical / Trunk Assessment: Kyphotic  Communication    Communication: HOH;Expressive difficulties  Cognition Arousal/Alertness: Awake/alert Behavior During Therapy: Restless;Impulsive;Agitated Overall Cognitive Status: History of cognitive impairments - at baseline       Memory: Decreased short-term memory              General Comments      Exercises Other Exercises Other Exercises: Pt sits at EOB >5 minutes for increased trunk stability with min guard and frequent VC for postural correction to maintain balance. Pt required UE support to prevent posterior lean. No c/o pain.       Assessment/Plan    PT Assessment Patient needs continued PT services  PT Diagnosis Difficulty walking;Generalized weakness   PT Problem List Decreased strength;Decreased activity tolerance;Decreased balance;Decreased mobility;Decreased cognition;Decreased safety awareness  PT Treatment Interventions Gait training;Therapeutic activities;Therapeutic exercise;Balance training;Patient/family education   PT Goals (Current goals can be found in the Care Plan section) Acute Rehab PT Goals Patient Stated Goal: unable PT Goal Formulation: Patient unable to participate in goal setting Time For Goal Achievement: 06/30/15 Potential to Achieve Goals: Poor    Frequency Min 2X/week   Barriers to discharge   none known at this time    Co-evaluation               End of Session Equipment Utilized During Treatment: Gait belt Activity Tolerance: Treatment limited secondary to agitation;Patient tolerated treatment well Patient left: in bed;with call bell/phone within reach;with bed alarm set Nurse Communication: Mobility status         Time: [id number] PT Time Calculation (min) (ACUTE ONLY): 28 min   Charges:   PT Evaluation $PT Eval Moderate Complexity: 1 Procedure PT Treatments $Therapeutic Activity: 8-22 mins   PT G Codes:        Neoma Laming, PT, DPT  06/16/2015, 4:54 PM [phone]

## 2015-06-16 NOTE — Progress Notes (Signed)
Pt has rested quietly most of the day. Was a little impulsive this morning, but settled down when daughter-in-law arrived. She was able to feed him some breakfast. He napped off and on throughout the day with periods of agitation, not wanting [place] to give him meds or food. Not impulsive at this time. No complaints. Will continue to monitor.

## 2015-06-16 NOTE — Progress Notes (Signed)
[place] at [place] NAME: Barry Coffey    MR#:  [id number]  DATE OF BIRTH:  1926/02/16  SUBJECTIVE: 82 year old male patient with dementia, hyperlipidemia, COPD admitted because of worsening confusion, poor by mouth intake for the last 2 days. Admitted for sepsis of unknown origin with lactic acid of 3. 0.  Patient awake but not oriented to time place percent. Able to feed him self little bit..   CHIEF COMPLAINT:   Chief Complaint  Patient presents with  . Urinary Tract Infection  . Hypotension    REVIEW OF SYSTEMS:    Review of Systems  Unable to perform ROS: dementia    Nutrition:  Tolerating Diet: Not eating     DRUG ALLERGIES:  No Known Allergies  VITALS:  Blood pressure 169/73, pulse 54, temperature 97.8 F (36.6 C), temperature source Oral, resp. rate 16, height 5\' 10"  (1.778 m), weight 69.037 kg (152 lb 3.2 oz), SpO2 100 %.  PHYSICAL EXAMINATION:   Physical Exam  GENERAL:  82 y.o.-year-old patient lying in the bed with no acute distress.  EYES: Pupils equal, round, reactive to light and accommodation. No scleral icterus. Extraocular muscles intact.  HEENT: Head atraumatic, normocephalic. Oropharynx and nasopharynx clear.  NECK:  Supple, no jugular venous distention. No thyroid enlargement, no tenderness.  LUNGS: Normal breath sounds bilaterally, no wheezing, rales,rhonchi or crepitation. No use of accessory muscles of respiration.  CARDIOVASCULAR: S1, S2 normal. No murmurs, rubs, or gallops.  ABDOMEN: Soft, nontender, nondistended. Bowel sounds present. No organomegaly or mass.  EXTREMITIES: No pedal edema, cyanosis, or clubbing.  NEUROLOGIC: Not able to do full neurological exam because of dementia and not following commands.  PSYCHIATRIC: Awake but disoriented,.  SKIN: No obvious rash, lesion, or ulcer.    LABORATORY PANEL:   CBC  Recent Labs Lab 06/14/15 0316  WBC 7.0  HGB 11.4*  HCT 34.1*   PLT 184   ------------------------------------------------------------------------------------------------------------------  Chemistries   Recent Labs Lab 06/13/15 2004  06/16/15 0513  NA 143  < > 142  K 3.7  < > 3.3*  CL 110  < > 113*  CO2 24  < > 25  GLUCOSE 139*  < > 87  BUN 26*  < > 12  CREATININE 1.20  < > 0.88  CALCIUM 8.3*  < > 8.0*  AST 134*  --   --   ALT 144*  --   --   ALKPHOS 136*  --   --   BILITOT 1.2  --   --   < > = values in this interval not displayed. ------------------------------------------------------------------------------------------------------------------  Cardiac Enzymes No results for input(s): TROPONINI in the last 168 hours. ------------------------------------------------------------------------------------------------------------------  RADIOLOGY:  No results found.   ASSESSMENT AND PLAN:   Principal Problem:   Sepsis ([place]) Active Problems:   Altered mental status   Pressure ulcer   #1 sepsis of unknown origin 'blood cultures, urine cultures are negative to date so we discontinued antibiotics. No fever. . #2 acute renal failure due to dehydration, poor by mouth intake: Due to ATN improved with IV hydration. #3 hypokalemia replace the potassium. #4 severe dementia ; his Ativan as needed, continue Seroquel 50 mg at bedtime. Patient is on low air mattress for skin protection. Physical therapy consult today.  Hypothyroidism continue Synthroid.  #6 of bladder cancer:;  7 hypothermia; with the core body temperature 91.7 Fat home ,it resolved. D/w DIL   8. Failure to thrive secondary to dementia  and poor by mouth intake:appreciate palliative care input.re  PT consult. More than 50% of time spent in coordination and counseling and the charts.  All the records are reviewed and case discussed with Care Management/Social Workerr. Management plans discussed with the patient, family and they are in agreement.  CODE STATUS: DO NOT  RESUSCITATE TOTAL TIME TAKING CARE OF THIS PATIENT: 48minutes.   POSSIBLE D/C IN 1-2 DAYS, DEPENDING ON CLINICAL CONDITION.   Epifanio Lesches M.D on 06/16/2015 at 12:37 PM  Between 7am to 6pm - Pager - [phone]  After 6pm go to [email or web address] - password [email or web address] [place] [facility]  Office  [phone]  CC: Primary care physician; Tracie Harrier, MD

## 2015-06-16 NOTE — Progress Notes (Addendum)
Visit made. Patient seen lying in bed, eyes open, greeted this RN. He is now in a  Low air loss mattress for skin protection. Per RNCM Orvan July and staff RN Lovena Le patient has a physical therapy consult pending. Lovena Le also reported that patient's daughter in law [name] was present this morning and fed patient breakfast and he was able to take his oral meidcations. He denied pain and declined a drink during visit. He continues on IV fluids. Per chart note review patient's cymbalta, celexa and paxil have been discontinued. He continues on seroquel 50 mg at bedtime. He required a dose of PRN lorazepam yesterday for anxiety. Patient resting, appeared comfortable at end of visit. Will continue to follow and update hospice team.  [name] RN, BSN, [facility] [place], [facility] [phone] c

## 2015-06-16 NOTE — Progress Notes (Signed)
Initial Nutrition Assessment    INTERVENTION:   Meals and Snacks: recommend downgrading to Dysphagia III diet as pt is edentulous and current diet order with comments indicating that pt needs softer foods to eat; Cater to patient preferences Medical Food Supplement Therapy: continue Ensure as ordered, recommend addition of Magic Cup BID  NUTRITION DIAGNOSIS:   Inadequate oral intake related to lethargy/confusion, chronic illness as evidenced by per patient/family report.  GOAL:   Patient will meet greater than or equal to 90% of their needs  MONITOR:    (Energy Intake, Anthropometrics, Digestive System, Electrolyte/Renal Profile, Cognition)  REASON FOR ASSESSMENT:   Consult Assessment of nutrition requirement/status  ASSESSMENT:   Pt admitted with worsening confusion and poor po intake for 2 days; admitted with sepsis, ARF due to dehydration; FTT with severe dementia at baseline. Pt sleeping on visit today, Lovena Le RN requested that Probation officer not wake pt as pt had been grumpy earlier   Past Medical History  Diagnosis Date  . Hyperlipidemia   . Dementia   . Depression   . Anxiety   . Bladder cancer ([employer]) 2011    s/p resection with mitomycin injection;Dr.Stoioff  . COPD (chronic obstructive pulmonary disease) ([place])   . Diabetes [facility])      Diet Order: Heart Healthy Diet   Energy Intake: pt had not touched lunch tray on visit today; per [employer], pt did not want lunch, wanted to sleep. RN reports that family assisted pt at breakfast this AM and he ate relatively well including drinking some Ensure. Per Hospice notes, pt ate 25% of lunch yesterday and tea plus Ensure and 3/4 Ice cream  Food and Nutrition Related History: unable to assess as no family at bedside   Recent Labs Lab 06/13/15 2004 06/14/15 0316 06/16/15 0513  NA 143 144 142  K 3.7 3.3* 3.3*  CL 110 114* 113*  CO2 24 23 25   BUN 26* 26* 12  CREATININE 1.20 1.03 0.88  CALCIUM 8.3* 8.2* 8.0*  GLUCOSE 139*  110* 87    Meds: NS at 50 ml/hr, potassium chloride  Nutrition Focused Physical Exam:  Unable to complete Nutrition-Focused physical exam at time; RN requesting that Probation officer not wake pt   Height:   Ht Readings from Last 1 Encounters:  06/14/15 5\' 10"  (1.778 m)    Weight: overall weight trend down; 6.7% wt loss since November if weight encounters are correct  Wt Readings from Last 1 Encounters:  06/14/15 152 lb 3.2 oz (69.037 kg)    Filed Weights   06/13/15 1930 06/14/15 0223  Weight: 150 lb (68.04 kg) 152 lb 3.2 oz (69.037 kg)   Wt Readings from Last 10 Encounters:  06/14/15 152 lb 3.2 oz (69.037 kg)  03/23/15 163 lb 2.3 oz (74 kg)  03/05/15 158 lb 11.2 oz (71.986 kg)  02/26/15 174 lb (78.926 kg)  12/17/14 165 lb 6.4 oz (75.025 kg)  08/30/14 180 lb 12.4 oz (82 kg)  01/04/13 184 lb (83.462 kg)  05/17/12 287 lb 4 oz (130.296 kg)  04/10/12 189 lb (85.73 kg)    BMI:  Body mass index is 21.84 kg/(m^2).  Estimated Nutritional Needs:   Kcal:  1640-1967 kcals (BEE 1366, 1.2 AF, 1.0-1.2 IF)   Protein:  69-83 g (1.0-1.2 g/kg)   Fluid:  1725-2070 mL (25-30 ml/kg)    MODERATE Care Level  Kerman Passey MS, RD, LDN [phone] Pager  [phone] Weekend/On-Call Pager

## 2015-06-17 MED ORDER — ENSURE ENLIVE PO LIQD: 237.0000 mL | Freq: Three times a day (TID) | ORAL | Status: AC

## 2015-06-17 NOTE — Progress Notes (Signed)
Patient/Daughter-in-law given discharge teaching and paperwork regarding medications, diet, follow-up appointments and activity. Patient/family understanding verbalized. No complaints at this time. IV and telemetry discontinued prior to leaving. Skin assessment as previously charted and vitals are stable; on room air. Patient being discharged to home - will be followed by hospice (seen by [name] today). Daughter-in-law present during discharge teaching. No further needs by Care Management. EMS called for transport - DNR form in packet prepared by CSW.

## 2015-06-17 NOTE — Progress Notes (Signed)
Per Dr. Vianne Bulls, patient going home with hospice care.

## 2015-06-17 NOTE — Progress Notes (Signed)
Patient calling out. Checked on by another Therapist, sports. Patient found wedged between bed rails. Assisted back into bed by 4 RNs and 1 NT. Pt agitated and combative. Skin tear on wrist and redness on back noted. MD notified. Family notified. Vital signs stable. Patient cleaned up. Combative and agitated throughout process. Given medication. Will continue to monitor.

## 2015-06-17 NOTE — Progress Notes (Signed)
[place] at [place] NAME: Barry Coffey    MR#:  [id number]  DATE OF BIRTH:  January 27, 1926  SUBJECTIVE: 82 year old male patient with dementia, hyperlipidemia, COPD admitted because of worsening confusion, poor by mouth intake for the last 2 days.  Sepsis workup is negative, patient is stable for discharge to home with home hospice today.  CHIEF COMPLAINT:   Chief Complaint  Patient presents with  . Urinary Tract Infection  . Hypotension    REVIEW OF SYSTEMS:    Review of Systems  Unable to perform ROS: dementia    Nutrition:  Tolerating Diet: Not eating     DRUG ALLERGIES:  No Known Allergies  VITALS:  Blood pressure 140/93, pulse 82, temperature 97.2 F (36.2 C), temperature source Oral, resp. rate 18, height 5\' 10"  (1.778 m), weight 69.037 kg (152 lb 3.2 oz), SpO2 98 %.  PHYSICAL EXAMINATION:   Physical Exam  GENERAL:  82 y.o.-year-old patient lying in the bed with no acute distress.  EYES: Pupils equal, round, reactive to light and accommodation. No scleral icterus. Extraocular muscles intact.  HEENT: Head atraumatic, normocephalic. Oropharynx and nasopharynx clear.  NECK:  Supple, no jugular venous distention. No thyroid enlargement, no tenderness.  LUNGS: Normal breath sounds bilaterally, no wheezing, rales,rhonchi or crepitation. No use of accessory muscles of respiration.  CARDIOVASCULAR: S1, S2 normal. No murmurs, rubs, or gallops.  ABDOMEN: Soft, nontender, nondistended. Bowel sounds present. No organomegaly or mass.  EXTREMITIES: No pedal edema, cyanosis, or clubbing.  NEUROLOGIC: Not able to do full neurological exam because of dementia and not following commands.  PSYCHIATRIC: Awake but disoriented,.  SKIN: No obvious rash, lesion, or ulcer.    LABORATORY PANEL:   CBC  Recent Labs Lab 06/14/15 0316  WBC 7.0  HGB 11.4*  HCT 34.1*  PLT 184    ------------------------------------------------------------------------------------------------------------------  Chemistries   Recent Labs Lab 06/13/15 2004  06/16/15 0513  NA 143  < > 142  K 3.7  < > 3.3*  CL 110  < > 113*  CO2 24  < > 25  GLUCOSE 139*  < > 87  BUN 26*  < > 12  CREATININE 1.20  < > 0.88  CALCIUM 8.3*  < > 8.0*  AST 134*  --   --   ALT 144*  --   --   ALKPHOS 136*  --   --   BILITOT 1.2  --   --   < > = values in this interval not displayed. ------------------------------------------------------------------------------------------------------------------  Cardiac Enzymes No results for input(s): TROPONINI in the last 168 hours. ------------------------------------------------------------------------------------------------------------------  RADIOLOGY:  No results found.   ASSESSMENT AND PLAN:   Principal Problem:   Sepsis ([place]) Active Problems:   Altered mental status   Pressure ulcer   #1 sepsis of unknown origin 'blood cultures, urine cultures are negative to date so we discontinued antibiotics. No fever. . #2 acute renal failure due to dehydration, poor by mouth intake: Due to ATN improved with IV hydration. #3 hypokalemia replace the potassium. #4 severe dementia ; his Ativan as needed, continue Seroquel 50 mg at bedtime. Patient is on low air mattress for skin protection. Physical therapy consult today.  Hypothyroidism continue Synthroid.  #6 of bladder cancer:;  7 hypothermia; with the core body temperature 91.7 Fat home ,it resolved. D/w DIL   8.  Patient will be going home with home hospice today More than 50% of time spent in coordination and counseling  and the charts.  All the records are reviewed and case discussed with Care Management/Social Workerr. Management plans discussed with the patient, family and they are in agreement.  CODE STATUS: DO NOT RESUSCITATE TOTAL TIME TAKING CARE OF THIS PATIENT: 70minutes.    POSSIBLE D/C IN 1-2 DAYS, DEPENDING ON CLINICAL CONDITION.   Epifanio Lesches M.D on 06/17/2015 at 1:03 PM  Between 7am to 6pm - Pager - [phone]  After 6pm go to [email or web address] - password [email or web address] [place] [facility]  Office  [phone]  CC: Primary care physician; Tracie Harrier, MD

## 2015-06-17 NOTE — Discharge Instructions (Signed)
Dysphagia  3 diet with thin liquid

## 2015-06-17 NOTE — Care Management Note (Signed)
Case Management Note  Patient Details  Name: Barry Coffey MRN: [id number] Date of Birth: July 01, 1925  Subjective/Objective:  Spoke with daughter, [name] who is at bedside. Informed her I have updated [name] with [place] of patient's discharge. PT recommended no PT follow up. Patient will be transferred by car.   Updated primary nurse.                 Action/Plan: Home with resumption of care by [facility]  Expected Discharge Date:    06/17/2015              Expected Discharge Plan:  Home w Hospice Care  In-House Referral:     Discharge planning Services     Post Acute Care Choice:  Resumption of Svcs/PTA Provider Choice offered to:     DME Arranged:    DME Agency:     HH Arranged:    [place] Agency:  [facility]  Status of Service:  Completed, signed off  Medicare Important Message Given:    Date Medicare IM Given:    Medicare IM give by:    Date Additional Medicare IM Given:    Additional Medicare Important Message give by:     If discussed at [place] of Stay Meetings, dates discussed:    Additional Comments:  Jolly Mango, RN 06/17/2015, 10:19 AM

## 2015-06-17 NOTE — Progress Notes (Addendum)
CSW was consulted by RN Case Manager stating that patient will need non-emergent EMS arranged at discharge. Patient's address was verified. EMS from completed and given to RN requesting EMS to be arranged. There are no other CSW needs at this time. CSW is available if a need were to arise. CSW is signing off.   Ernest Pine, MSW, [place] Social Work Department [phone]

## 2015-06-17 NOTE — Care Management (Signed)
Patient received ativan earlier in the shift and is lethargic. Will need ambulance transport. CSW updated. DNR signed and placed on chart for transport. Verified home address if correct on demographics.

## 2015-06-17 NOTE — Progress Notes (Signed)
Visit made. Patient seen lying in bed, daughter in law [name] at bedside. She confirmed her choice to take Barry Coffey home, to continue with hospice services, discharge order in place. He is currently very drowsy, and will be unable to transport home via car. CSW and CMRN made aware, EMS form to be completed. Signed DNR in place on patient's chart. [name] voiced understanding to call Hospice for any needs after returning home. Discharge information faxed to triage. Thank you. [name] RN, BSN, [place] [facility] [place], [facility] [phone] c

## 2015-06-17 NOTE — Progress Notes (Signed)
Santiago Glad from Hospice to come speak with patient and daughter-in-law prior to discharge.

## 2015-06-18 LAB — CULTURE, BLOOD (ROUTINE X 2)
CULTURE: NO GROWTH
CULTURE: NO GROWTH

## 2015-06-21 NOTE — Discharge Summary (Signed)
Barry Coffey, is a 82 y.o. male  DOB 10/03/1925  MRN [id number].  Admission date:  06/13/2015  Admitting Physician  Barry Shelling, MD  Discharge Date:  06/17/2015   Primary MD  Barry Harrier, MD  Recommendations for primary care physician for things to follow:   Follow up with PMD as needed/   Admission Diagnosis  Failure to thrive in adult [R62.7] Sepsis, due to unspecified organism [facility]) [A41.9]   Discharge Diagnosis  Failure to thrive in adult [R62.7] Sepsis, due to unspecified organism [facility]) [A41.9]    Principal Problem:   Sepsis ([place]) Active Problems:   Altered mental status   Pressure ulcer      Past Medical History  Diagnosis Date  . Hyperlipidemia   . Dementia   . Depression   . Anxiety   . Bladder cancer ([place]) 2011    s/p resection with mitomycin injection;Dr.Stoioff  . COPD (chronic obstructive pulmonary disease) ([place])   . Diabetes [facility])     Past Surgical History  Procedure Laterality Date  . Back surgery      x4  . Cholecystectomy    . Foot surgery    . Femur im nail Left 12/18/2014    Procedure: INTRAMEDULLARY (IM) NAIL FEMORAL;  Surgeon: Thornton Park, MD;  Location: [facility] ORS;  Service: Orthopedics;  Laterality: Left;       History of present illness and  Hospital Course:     Kindly see H&P for history of present illness and admission details, please review complete Labs, Consult reports and Test reports for all details in brief  HPI  from the history and physical done on the day of ad due to mission 82 yr old  [name] with  H/o severe demenitia brought by family  Due to poor po intake,and fever.,admitted for fever/dehydration.                  Hospital Course  1.sepsis of unknown origin ;started on iv fluids,lactic acid on admission is 3 blood and urine cultures are  negative,abx are stopped.contiued on iv fluids.sepsis  Work up is negative.thought to have dehydration rather than infection. 2.hypokalemia;replaced potassium, 3.severe dementia;followe by hospice at [facility] by hospice liason .continued seroquel.discharged home with hospice. 4.Acute renal failure due to ATN;improved with IV hydration.  Discharge Condition: stable   Follow UP      Discharge Instructions  and  Discharge Medications       Medication List    STOP taking these medications        doxycycline 100 MG capsule  Commonly known as:  VIBRAMYCIN     enoxaparin 40 MG/0.4ML injection  Commonly known as:  LOVENOX     NORCO 5-325 MG tablet  Generic drug:  HYDROcodone-acetaminophen     potassium chloride 10 MEQ tablet  Commonly known as:  K-DUR,KLOR-CON      TAKE these medications        ALEVE 220 MG Caps  Generic drug:  Naproxen Sodium  Take by mouth. Reported on 06/14/2015     aluminum-magnesium hydroxide-simethicone [id number] MG/5ML Susp  Commonly known as:  MAALOX  Take by mouth. Reported on 06/14/2015     atorvastatin 10 MG tablet  Commonly known as:  LIPITOR  Take 10 mg by mouth. Reported on 06/14/2015     citalopram 10 MG tablet  Commonly known as:  CELEXA  Take by mouth. Reported on 06/14/2015     D 5000 5000 units Tabs  Generic drug:  Cholecalciferol  Take by mouth. Reported on 06/14/2015     docusate sodium 100 MG capsule  Commonly known as:  COLACE  Take 100 mg by mouth. Reported on 06/14/2015     donepezil 10 MG tablet  Commonly known as:  ARICEPT  Take 10 mg by mouth at bedtime. Reported on 06/14/2015     DULoxetine 30 MG capsule  Commonly known as:  CYMBALTA  Take 30 mg by mouth. Reported on 06/14/2015     feeding supplement (ENSURE ENLIVE) Liqd  Take 237 mLs by mouth 3 (three) times daily with meals.     ferrous sulfate 325 (65 FE) MG tablet  Take 1 tablet (325 mg total) by mouth daily with breakfast.     ipratropium 0.06 % nasal  spray  Commonly known as:  ATROVENT  Place into the nose. Reported on 06/14/2015     levothyroxine 50 MCG tablet  Commonly known as:  SYNTHROID, LEVOTHROID  Take 50 mcg by mouth daily before breakfast.     LORazepam 0.5 MG tablet  Commonly known as:  ATIVAN  Take 0.5 mg by mouth every 2 (two) hours as needed for anxiety.     magnesium hydroxide 400 MG/5ML suspension  Commonly known as:  MILK OF MAGNESIA  Take by mouth. Reported on 06/14/2015     PARoxetine 20 MG tablet  Commonly known as:  PAXIL  Take 20 mg by mouth. Reported on 06/14/2015     potassium chloride 10 MEQ tablet  Commonly known as:  K-DUR  Take by mouth. Reported on 06/14/2015     pravastatin 40 MG tablet  Commonly known as:  PRAVACHOL  Take 40 mg by mouth every evening. Reported on 06/14/2015     QUEtiapine 25 MG tablet  Commonly known as:  SEROQUEL  Take 50 mg by mouth at bedtime.     senna 8.6 MG Tabs tablet  Commonly known as:  SENOKOT  Take 1 tablet by mouth daily as needed for mild constipation.     Vitamin D (Ergocalciferol) 50000 units Caps capsule  Commonly known as:  DRISDOL  Take 50,000 Units by mouth every 7 (seven) days. Reported on 06/14/2015          Diet and Activity recommendation: See Discharge Instructions above   Consults obtained -none   Major procedures and Radiology Reports - PLEASE review detailed and final reports for all details, in brief -    Dg Chest Portable 1 View  06/13/2015  CLINICAL DATA:  82 year old male with weakness EXAM: PORTABLE CHEST 1 VIEW COMPARISON:  Radiograph dated 03/23/2015 FINDINGS: Single portable view of the chest does not demonstrate a focal consolidation. There is no pleural effusion or pneumothorax. Small faint linear atelectasis/scarring noted in the right mid lung field. The cardiac silhouette is within normal limits with there is osteopenia with degenerative changes of the spine. No acute osseous pathology. IMPRESSION: No active disease.  Electronically Signed   By: Barry Coffey M.D.   On: 06/13/2015 20:29    Micro Results     Recent Results (from the past 240 hour(s))  Culture, blood (routine x 2)     Status: None   Collection Time: 06/13/15  8:03 PM  Result Value Ref Range Status   Specimen Description BLOOD LEFT ARM  Final   Special Requests BOTTLES DRAWN AEROBIC AND ANAEROBIC [place]  Final   Culture NO GROWTH 5 DAYS  Final   Report Status 06/18/2015 FINAL  Final  Urine culture     Status:  None   Collection Time: 06/13/15  8:04 PM  Result Value Ref Range Status   Specimen Description URINE, RANDOM  Final   Special Requests NONE  Final   Culture NO GROWTH 2 DAYS  Final   Report Status 06/15/2015 FINAL  Final  Culture, blood (routine x 2)     Status: None   Collection Time: 06/13/15  8:08 PM  Result Value Ref Range Status   Specimen Description BLOOD RIGHT ARM  Final   Special Requests BOTTLES DRAWN AEROBIC AND ANAEROBIC 2CCAERO,3CCANA  Final   Culture NO GROWTH 5 DAYS  Final   Report Status 06/18/2015 FINAL  Final       Today   Subjective:   Barry Coffey today has no fever,has dementia at baseline.ready for  Discharge to home with hospice,.  Objective:   Blood pressure 144/88, pulse 82, temperature 97.2 F (36.2 C), temperature source Oral, resp. rate 18, height 5\' 10"  (1.778 m), weight 69.037 kg (152 lb 3.2 oz), SpO2 98 %.  No intake or output data in the 24 hours ending 06/21/15 2136  Exam Awake  But has baseline dementia, No new F.N deficits, Normal affect [place].AT,PERRAL Supple Neck,No JVD, No cervical lymphadenopathy appriciated.  Symmetrical Chest wall movement, Good air movement bilaterally, CTAB RRR,No Gallops,Rubs or new Murmurs, No Parasternal Heave +ve B.Sounds, Abd Soft, Non tender, No organomegaly appriciated, No rebound -guarding or rigidity. No Cyanosis, Clubbing or edema, No new Rash or bruise  Data Review   CBC w Diff:  Lab Results  Component Value Date   WBC  7.0 06/14/2015   WBC 7.9 07/27/2013   HGB 11.4* 06/14/2015   HGB 12.0* 07/27/2013   HCT 34.1* 06/14/2015   HCT 35.8* 07/27/2013   PLT 184 06/14/2015   PLT 314 07/27/2013   LYMPHOPCT 3 06/13/2015   LYMPHOPCT 10.9 07/25/2013   MONOPCT 8 06/13/2015   MONOPCT 9 07/27/2013   MONOPCT 9.8 07/25/2013   EOSPCT 0 06/13/2015   EOSPCT 1.0 07/25/2013   BASOPCT 0 06/13/2015   BASOPCT 0.4 07/25/2013    CMP:  Lab Results  Component Value Date   NA 142 06/16/2015   NA 138 07/27/2013   K 3.3* 06/16/2015   K 3.9 07/27/2013   CL 113* 06/16/2015   CL 105 07/27/2013   CO2 25 06/16/2015   CO2 29 07/27/2013   BUN 12 06/16/2015   BUN 19* 07/27/2013   CREATININE 0.88 06/16/2015   CREATININE 1.03 07/27/2013   PROT 6.2* 06/13/2015   PROT 7.0 07/23/2013   ALBUMIN 3.1* 06/13/2015   ALBUMIN 2.4* 07/23/2013   BILITOT 1.2 06/13/2015   BILITOT 0.8 07/23/2013   ALKPHOS 136* 06/13/2015   ALKPHOS 82 07/23/2013   AST 134* 06/13/2015   AST 40* 07/23/2013   ALT 144* 06/13/2015   ALT 32 07/23/2013  .   Total Time in preparing paper work, data evaluation and todays exam - 78 minutes  Carlos Heber M.D on 2/222017 at 9:36 PM    Note: This dictation was prepared with Dragon dictation along with smaller phrase technology. Any transcriptional errors that result from this process are unintentional.

## 2015-08-24 DEATH — deceased

## 2015-11-07 IMAGING — CT CT HEAD W/O CM
2 series · 16 of 30 positions shown, 20 images · non-contrast
Comparison: 12/17/2014

CLINICAL DATA: Altered mental status.

EXAM:
CT HEAD WITHOUT CONTRAST
TECHNIQUE: Contiguous axial images were obtained from the base of the skull
through the vertex without intravenous contrast.

[Series 2: head wo · axial · 0.41mm/px · z∈[-29,+104]mm · 13 of 34 slices shown, 17 images]
[im 3/34  brain]
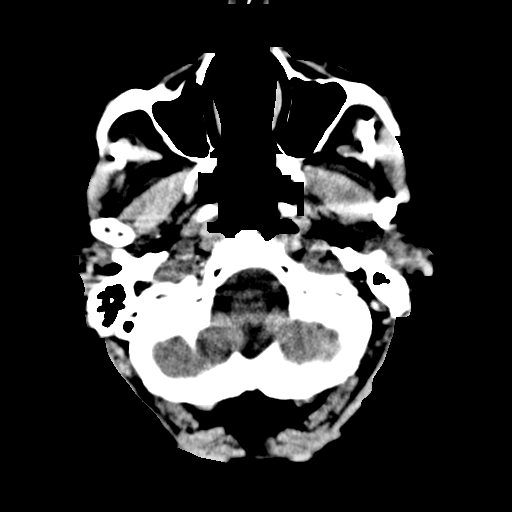
[im 3/34  bone]
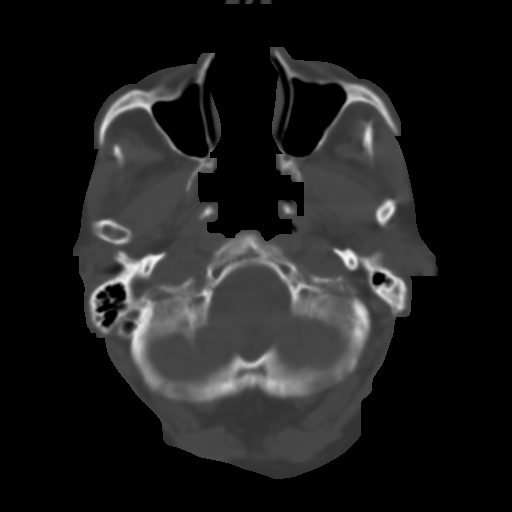
[im 5/34  brain]
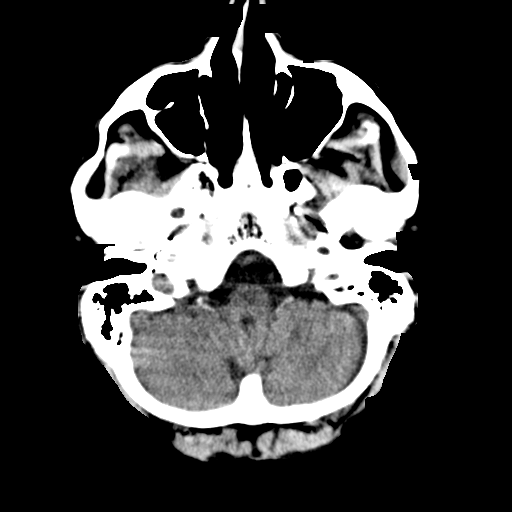
[im 8/34  brain]
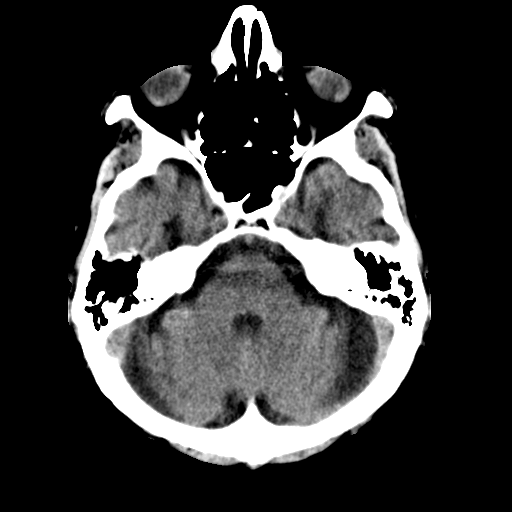
[im 10/34  brain]
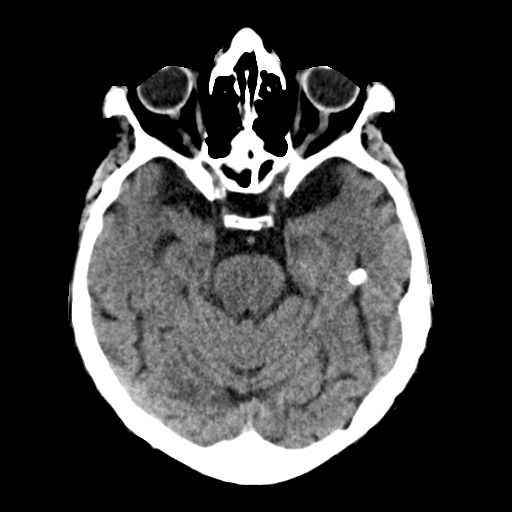
[im 12/34  brain]
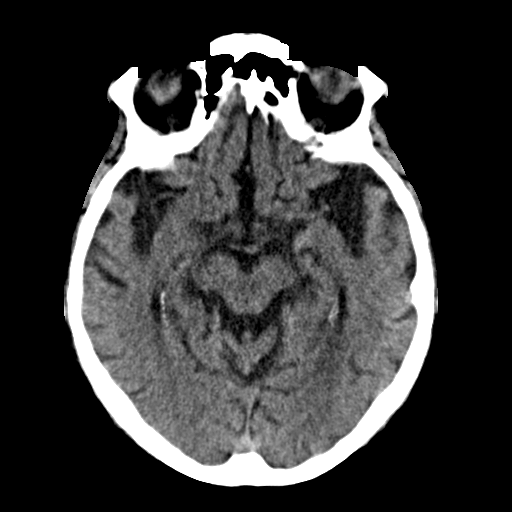
[im 12/34  bone]
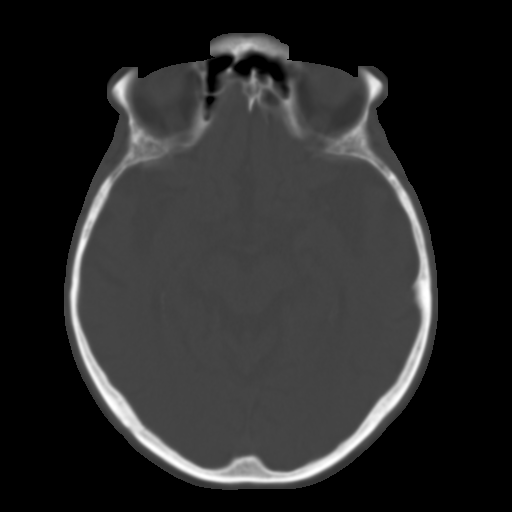
[im 15/34  brain]
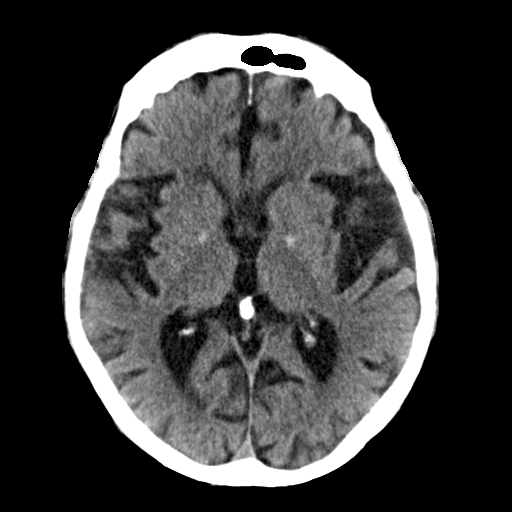
[im 17/34  brain]
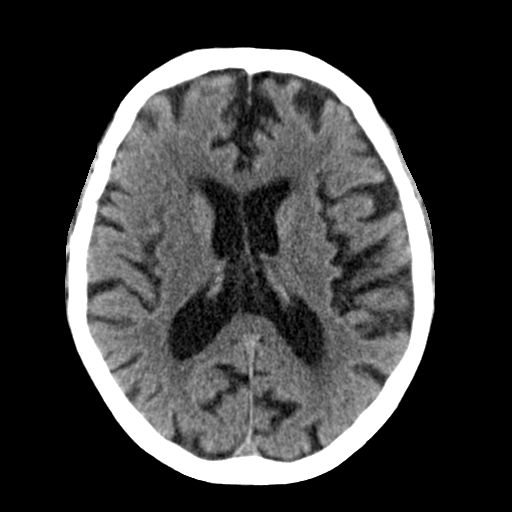
[im 19/34  brain]
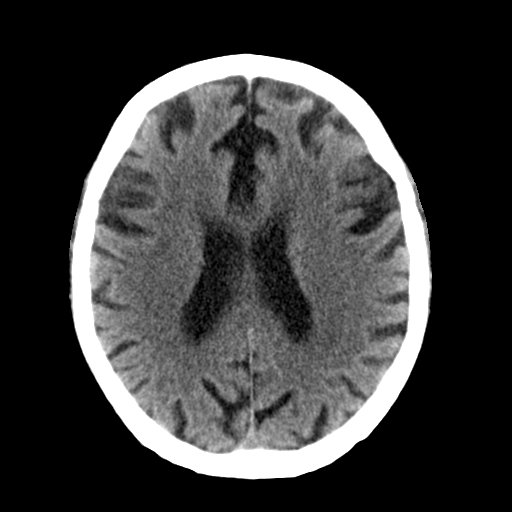
[im 22/34  brain]
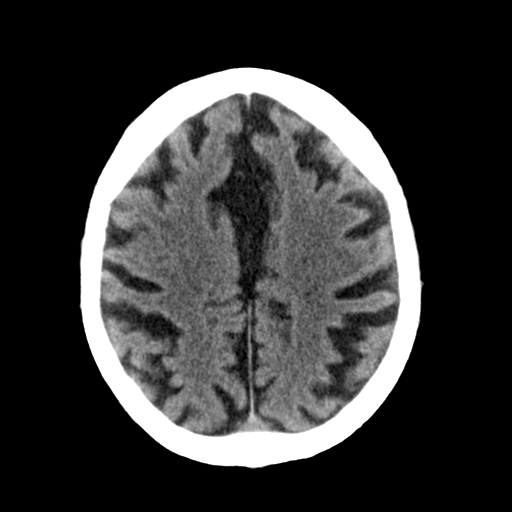
[im 22/34  bone]
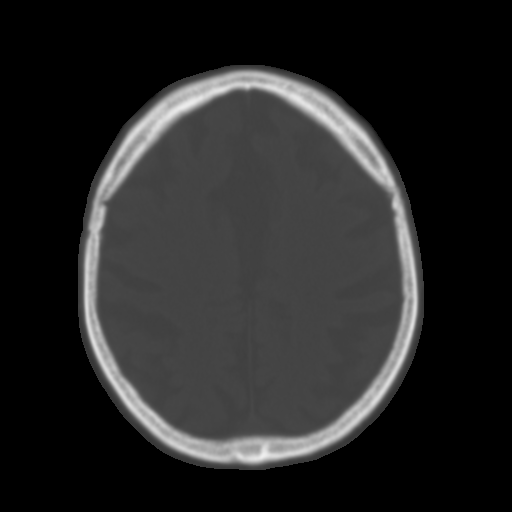
[im 24/34  brain]
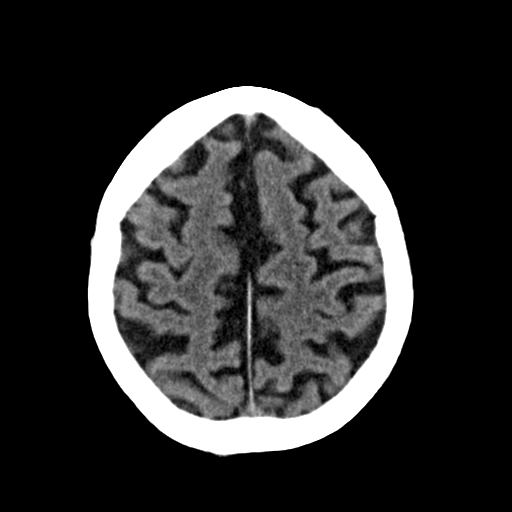
[im 26/34  brain]
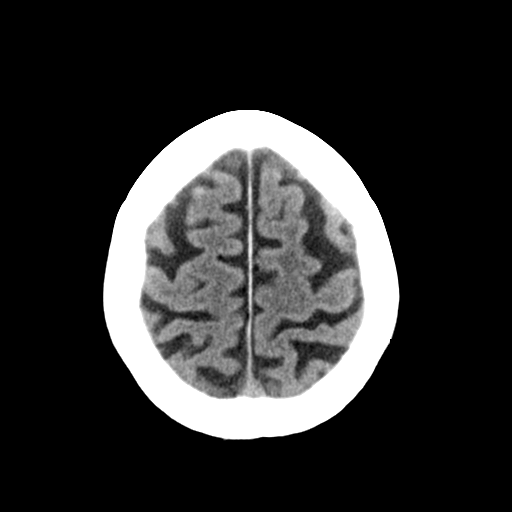
[im 29/34  brain]
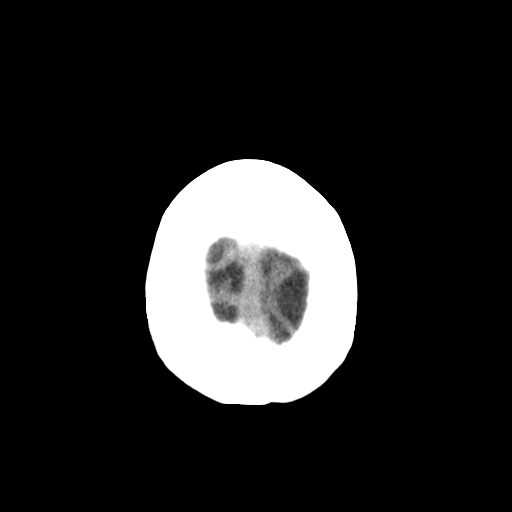
[im 31/34  brain]
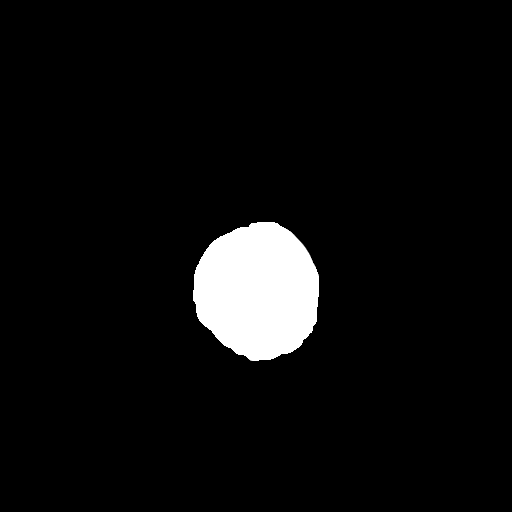
[im 31/34  bone]
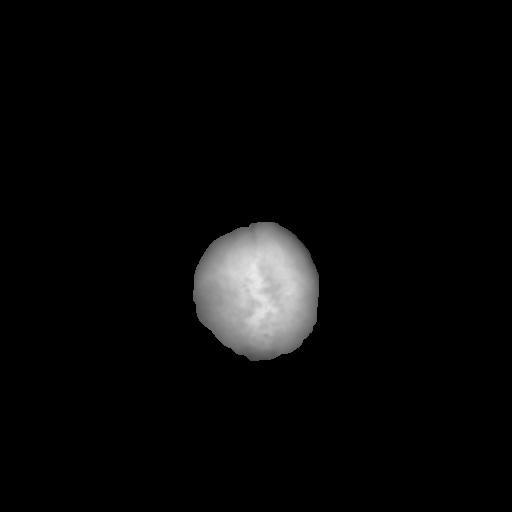

[Series 3: head bone · axial · 0.41mm/px · z∈[-29,+19]mm · 3 of 36 slices shown]
[im 3/36  bone]
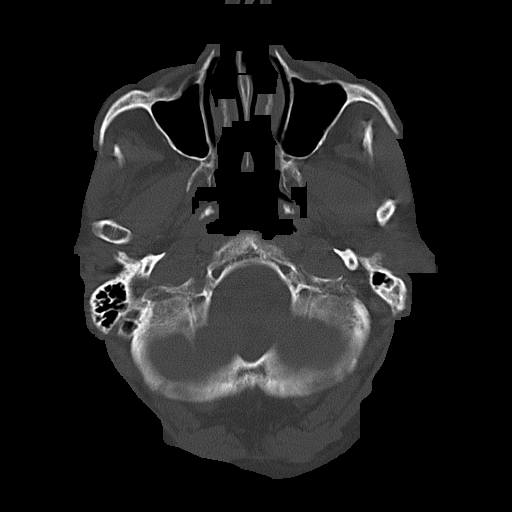
[im 8/36  bone]
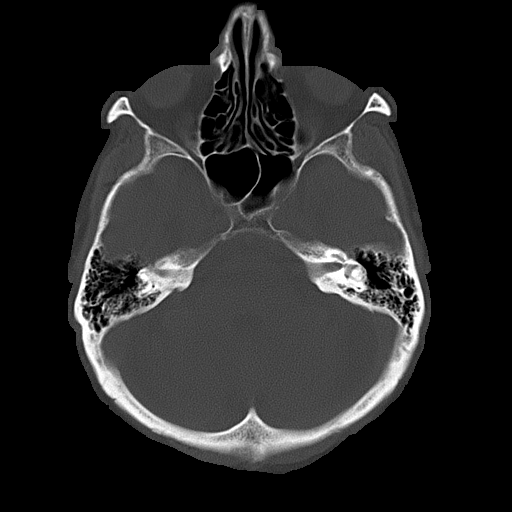
[im 13/36  bone]
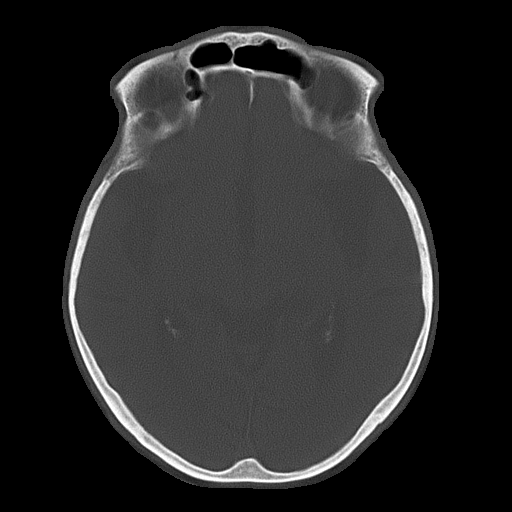

[16 of 30 positions shown; findings below may reference images not displayed]

FINDINGS: Prominence of the sulci and ventricles identified consistent with
brain atrophy. There is mild diffuse low attenuation throughout the
subcortical and periventricular white matter compatible with chronic
microvascular disease. No acute cortical infarct, hemorrhage, or
mass lesion ispresent. No significant extra-axial fluid collection
is present. The paranasal sinuses andmastoid air cells are clear.
The osseous skull is intact. Old healed right zygomatic arch
fracture.
IMPRESSION: 1. No acute intracranial abnormalities.
2. Small vessel ischemic change and brain atrophy.

## 2015-11-07 IMAGING — CR DG CHEST 1V PORT
1 series · 1 of 1 positions shown · non-contrast
Comparison: 12/17/2014

CLINICAL DATA: COPD. Bladder cancer and anxiety. Altered mental
status and shortness of breath.

EXAM:
PORTABLE CHEST 1 VIEW

[ap]
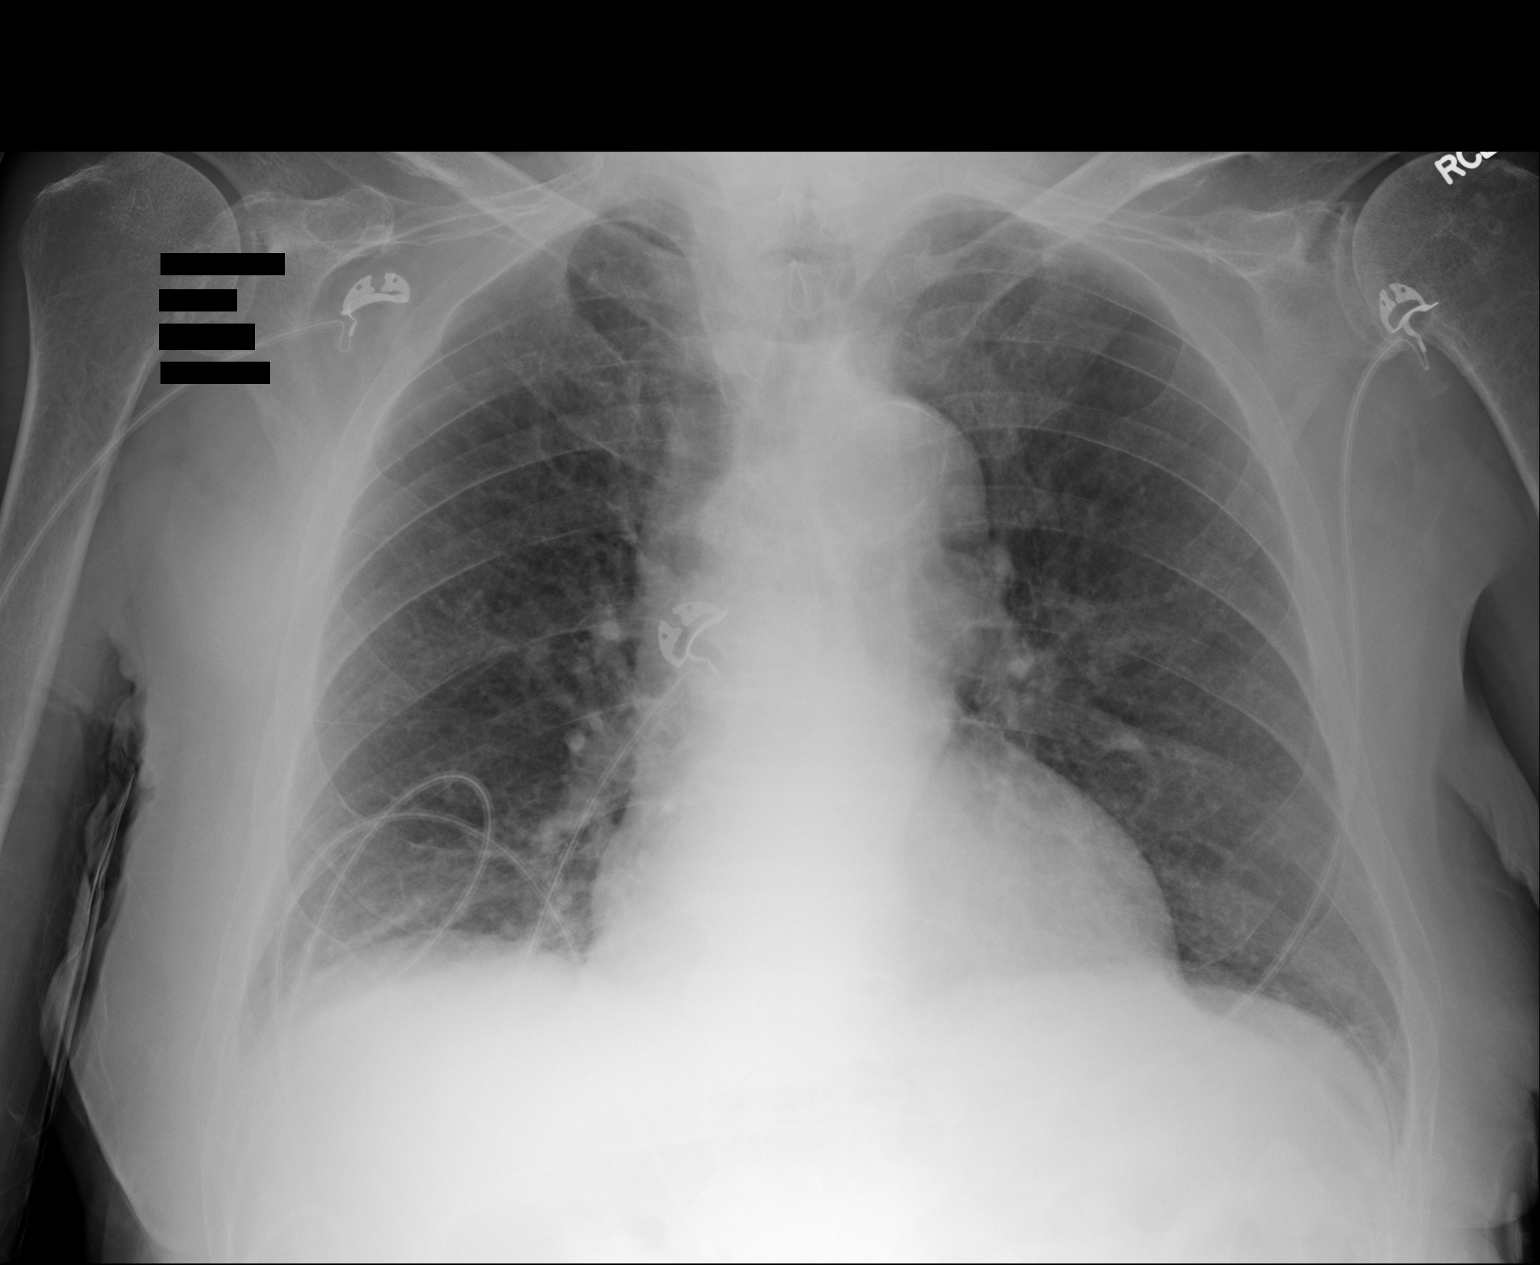

[1 of 1 positions shown; findings below may reference images not displayed]

FINDINGS: The heart size and mediastinal contours are within normal limits.
Aortic atherosclerosis noted. Pulmonary vascular congestion noted.
The visualized skeletal structures are unremarkable.
IMPRESSION: 1. Pulmonary vascular congestion.
2. Aortic atherosclerosis.
